# Patient Record
Sex: Female | Born: 1960 | Race: Black or African American | Hispanic: No | Marital: Married | Smoking: Former smoker
Health system: Southern US, Community
[De-identification: ages and names within clinical notes are randomized; demographics above are authoritative.]

## PROBLEM LIST (undated history)

## (undated) DIAGNOSIS — J189 Pneumonia, unspecified organism: Secondary | ICD-10-CM

## (undated) DIAGNOSIS — J40 Bronchitis, not specified as acute or chronic: Secondary | ICD-10-CM

## (undated) DIAGNOSIS — K8020 Calculus of gallbladder without cholecystitis without obstruction: Secondary | ICD-10-CM

## (undated) HISTORY — PX: TUBAL LIGATION: SHX77

## (undated) HISTORY — PX: CHOLECYSTECTOMY: SHX55

## (undated) HISTORY — PX: CERVICAL FUSION: SHX112

---

## 1998-06-26 ENCOUNTER — Ambulatory Visit (HOSPITAL_COMMUNITY): Admission: RE | Admit: 1998-06-26 | Discharge: 1998-06-26 | Payer: Self-pay | Admitting: Family Medicine

## 1998-06-26 ENCOUNTER — Encounter: Payer: Self-pay | Admitting: Family Medicine

## 1998-12-17 ENCOUNTER — Inpatient Hospital Stay (HOSPITAL_COMMUNITY): Admission: AD | Admit: 1998-12-17 | Discharge: 1998-12-20 | Payer: Self-pay | Admitting: Obstetrics and Gynecology

## 1999-03-22 ENCOUNTER — Encounter: Payer: Self-pay | Admitting: Family Medicine

## 1999-03-22 ENCOUNTER — Ambulatory Visit (HOSPITAL_COMMUNITY): Admission: RE | Admit: 1999-03-22 | Discharge: 1999-03-22 | Payer: Self-pay | Admitting: Family Medicine

## 1999-09-27 ENCOUNTER — Encounter: Payer: Self-pay | Admitting: Family Medicine

## 1999-09-27 ENCOUNTER — Ambulatory Visit (HOSPITAL_COMMUNITY): Admission: RE | Admit: 1999-09-27 | Discharge: 1999-09-27 | Payer: Self-pay | Admitting: Family Medicine

## 2002-10-14 ENCOUNTER — Other Ambulatory Visit: Admission: RE | Admit: 2002-10-14 | Discharge: 2002-10-14 | Payer: Self-pay | Admitting: Internal Medicine

## 2002-10-14 ENCOUNTER — Encounter: Payer: Self-pay | Admitting: Internal Medicine

## 2002-10-14 ENCOUNTER — Ambulatory Visit (HOSPITAL_COMMUNITY): Admission: RE | Admit: 2002-10-14 | Discharge: 2002-10-14 | Payer: Self-pay | Admitting: Internal Medicine

## 2003-10-16 ENCOUNTER — Other Ambulatory Visit: Admission: RE | Admit: 2003-10-16 | Discharge: 2003-10-16 | Payer: Self-pay | Admitting: Internal Medicine

## 2003-10-21 ENCOUNTER — Ambulatory Visit (HOSPITAL_COMMUNITY): Admission: RE | Admit: 2003-10-21 | Discharge: 2003-10-21 | Payer: Self-pay | Admitting: Internal Medicine

## 2004-09-29 ENCOUNTER — Other Ambulatory Visit: Admission: RE | Admit: 2004-09-29 | Discharge: 2004-09-29 | Payer: Self-pay | Admitting: Family Medicine

## 2005-10-03 ENCOUNTER — Other Ambulatory Visit: Admission: RE | Admit: 2005-10-03 | Discharge: 2005-10-03 | Payer: Self-pay | Admitting: Family Medicine

## 2005-10-18 ENCOUNTER — Ambulatory Visit (HOSPITAL_COMMUNITY): Admission: RE | Admit: 2005-10-18 | Discharge: 2005-10-18 | Payer: Self-pay | Admitting: Family Medicine

## 2006-10-11 ENCOUNTER — Other Ambulatory Visit: Admission: RE | Admit: 2006-10-11 | Discharge: 2006-10-11 | Payer: Self-pay | Admitting: Family Medicine

## 2006-10-23 ENCOUNTER — Ambulatory Visit (HOSPITAL_COMMUNITY): Admission: RE | Admit: 2006-10-23 | Discharge: 2006-10-23 | Payer: Self-pay | Admitting: Family Medicine

## 2007-10-15 ENCOUNTER — Other Ambulatory Visit: Admission: RE | Admit: 2007-10-15 | Discharge: 2007-10-15 | Payer: Self-pay | Admitting: Family Medicine

## 2007-10-25 ENCOUNTER — Ambulatory Visit (HOSPITAL_COMMUNITY): Admission: RE | Admit: 2007-10-25 | Discharge: 2007-10-25 | Payer: Self-pay | Admitting: Family Medicine

## 2007-11-08 ENCOUNTER — Ambulatory Visit (HOSPITAL_COMMUNITY): Admission: RE | Admit: 2007-11-08 | Discharge: 2007-11-08 | Payer: Self-pay | Admitting: Obstetrics and Gynecology

## 2008-10-15 ENCOUNTER — Other Ambulatory Visit: Admission: RE | Admit: 2008-10-15 | Discharge: 2008-10-15 | Payer: Self-pay | Admitting: Family Medicine

## 2008-10-27 ENCOUNTER — Ambulatory Visit (HOSPITAL_COMMUNITY): Admission: RE | Admit: 2008-10-27 | Discharge: 2008-10-27 | Payer: Self-pay | Admitting: Family Medicine

## 2009-10-19 ENCOUNTER — Other Ambulatory Visit: Admission: RE | Admit: 2009-10-19 | Discharge: 2009-10-19 | Payer: Self-pay | Admitting: Family Medicine

## 2009-11-02 ENCOUNTER — Ambulatory Visit (HOSPITAL_COMMUNITY): Admission: RE | Admit: 2009-11-02 | Discharge: 2009-11-02 | Payer: Self-pay | Admitting: Family Medicine

## 2009-11-05 ENCOUNTER — Encounter: Admission: RE | Admit: 2009-11-05 | Discharge: 2009-11-05 | Payer: Self-pay | Admitting: Family Medicine

## 2010-05-28 ENCOUNTER — Encounter: Admission: RE | Admit: 2010-05-28 | Discharge: 2010-05-28 | Payer: Self-pay | Admitting: Family Medicine

## 2010-06-30 ENCOUNTER — Ambulatory Visit (HOSPITAL_COMMUNITY)
Admission: RE | Admit: 2010-06-30 | Discharge: 2010-07-01 | Payer: Self-pay | Source: Home / Self Care | Attending: Neurological Surgery | Admitting: Neurological Surgery

## 2010-08-09 ENCOUNTER — Encounter
Admission: RE | Admit: 2010-08-09 | Discharge: 2010-08-09 | Payer: Self-pay | Source: Home / Self Care | Attending: Neurological Surgery | Admitting: Neurological Surgery

## 2010-08-15 ENCOUNTER — Encounter: Payer: Self-pay | Admitting: Family Medicine

## 2010-08-19 ENCOUNTER — Other Ambulatory Visit: Payer: Self-pay | Admitting: Neurological Surgery

## 2010-08-19 DIAGNOSIS — Z9889 Other specified postprocedural states: Secondary | ICD-10-CM

## 2010-09-27 ENCOUNTER — Other Ambulatory Visit (HOSPITAL_COMMUNITY): Payer: Self-pay | Admitting: Family Medicine

## 2010-09-27 DIAGNOSIS — Z1231 Encounter for screening mammogram for malignant neoplasm of breast: Secondary | ICD-10-CM

## 2010-10-05 LAB — BASIC METABOLIC PANEL
BUN: 7 mg/dL (ref 6–23)
CO2: 27 mEq/L (ref 19–32)
Creatinine, Ser: 0.94 mg/dL (ref 0.4–1.2)
GFR calc non Af Amer: >60 mL/min (ref >60)
Sodium: 140 mEq/L (ref 135–145)

## 2010-10-05 LAB — PROTIME-INR
INR: 0.89 (ref 0.00–1.49)
Prothrombin Time: 12.2 seconds (ref 11.6–15.2)

## 2010-10-05 LAB — DIFFERENTIAL
Basophils Relative: 0 % (ref 0–1)
Eosinophils Absolute: 0.1 10*3/uL (ref 0.0–0.7)
Neutrophils Relative %: 55 % (ref 43–77)

## 2010-10-05 LAB — CBC
HCT: 39.3 % (ref 36.0–46.0)
MCV: 86.2 fL (ref 78.0–100.0)
Platelets: 287 10*3/uL (ref 150–400)
RDW: 13.3 % (ref 11.5–15.5)

## 2010-10-05 LAB — APTT: aPTT: 26 seconds (ref 24–37)

## 2010-10-11 ENCOUNTER — Other Ambulatory Visit: Payer: Self-pay | Admitting: Neurological Surgery

## 2010-10-11 ENCOUNTER — Ambulatory Visit
Admission: RE | Admit: 2010-10-11 | Discharge: 2010-10-11 | Disposition: A | Discharge status: Home / Self Care | Payer: Managed Care, Other (non HMO) | Source: Ambulatory Visit | Attending: Neurological Surgery | Admitting: Neurological Surgery

## 2010-10-11 DIAGNOSIS — Z09 Encounter for follow-up examination after completed treatment for conditions other than malignant neoplasm: Secondary | ICD-10-CM

## 2010-11-08 ENCOUNTER — Ambulatory Visit (HOSPITAL_COMMUNITY)
Admission: RE | Admit: 2010-11-08 | Discharge: 2010-11-08 | Disposition: A | Discharge status: Home / Self Care | Payer: Managed Care, Other (non HMO) | Source: Ambulatory Visit | Attending: Family Medicine | Admitting: Family Medicine

## 2010-11-08 DIAGNOSIS — Z1231 Encounter for screening mammogram for malignant neoplasm of breast: Secondary | ICD-10-CM | POA: Insufficient documentation

## 2010-12-07 NOTE — Op Note (Signed)
Jessica Mclaughlin, Jessica Mclaughlin               ACCOUNT NO.:  [id number]   MEDICAL RECORD NO.:  [id number]          PATIENT TYPE:  AMB   LOCATION:  SDC                           FACILITY:  [facility]   PHYSICIAN:  Charles A. Delcambre, MDDATE OF BIRTH:  1960/08/12   DATE OF PROCEDURE:  11/08/2007  DATE OF DISCHARGE:                               OPERATIVE REPORT   PREOPERATIVE DIAGNOSIS:  Undesired fertility.   POSTOPERATIVE DIAGNOSES:  1. Undesired fertility.  2. Severe pelvic adhesive disease.   PROCEDURE:  Laparoscopic bilateral tubal ligation with bipolar cautery.   SURGEON:  Charles A. Delcambre, MD   ANESTHESIA:  General by the endotracheal route.   ASSISTANT:  None.   FINDINGS:  Normal tubes and ovaries bilaterally.  Dense adhesions of the  uterus to the anterior abdominal wall.  Multiple omental adhesions to  the anterior abdominal wall.   SPECIMENS:  None.   COMPLICATIONS:  None other than negotiating pelvic adhesive disease and  this was felt to be done safely.   ESTIMATED BLOOD LOSS:  Less than 5 mL.   COUNTS:  Instrument, sponge and needle count correct x2.   DESCRIPTION OF PROCEDURE:  The patient was taken to the operating room,  placed in supine position.  General anesthetic was induced without  difficulty.  She was then given sterile prep and drape in dorsal  lithotomy position.  Acorn cannula and tenacula were  used on the  cervix.  Quarter percent Marcaine 8 mL were placed, approximately 5 mL  at the umbilicus and 3 mL at the suprapubic site.  A 1 cm incision was  made at the umbilical site.  Anterior traction on the abdominal wall was  used to place the Veress needle.  Injection with aspiration and hanging  drop test all indicated intraperitoneal location.  Adequate  pneumoperitoneum was achieved with 2.5 liters CO2 insufflated.  With  again placing anterior traction on the abdominal wall the 10 mm scope  was placed after removal of the Veress needle.  There was no  evidence of  damage to bowel or bladder or vascular structures.  Careful inspection  of the area did yield there were omental adhesions but none were  bleeding and they appeared to be next to but had not been passed through  by the trocar.  This was verified with a 5 mm scope looking upward.  Omental and lateral sigmoid adhesions were taken down with bipolar  cautery and careful use of metzenbaum scissors.  this took approximately  30 minutes and there was no damage to bowel, vascular structures or  bladder.  A 5 mm stab incision was made suprapubically in the midline  two fingerbreadths above the symphysis pubis.  There was no damage to  bowel or bladder or vascular structures.  The patient was placed in  Trendelenburg and bowel was lifted out of the pelvis.  The bowel was not  adherent to the pelvis or to the abdominal wall but more so just omental  adhesions.  Decision was made to proceed with bipolar cautery so as not  to have to place another  trocar with the adhesions.  Bipolar cautery was  then used to coagulate approximately 3 cm of tube on either side  including down into the mesosalpinx.  Cautery was carried down until  tone indicated no further current.  There was no damage to surrounding  structures.  Desufflation was allowed to occur after a 5 mm scope had  been used through the lower port to look back at the umbilical port.  I  could see no evidence of any bowel injury, just nearby omental adhesions  from where the trocar was placed.  Desufflation was allowed to occur.  Low pressure visualization yielded good hemostasis.  Zero Vicryl was  used to close the fascia at the umbilical site.  The umbilical incision  was thereafter noted to be amenable to Dura-Bond and Dura-Bond was  placed to close the skin.  Dura-Bond was also placed over the 5 mm port  site without difficulty.  The tenacula was removed from the cervix.  Hemostasis was adequate.  Acorn cannula and speculum were  removed.  The  patient was awakened and taken to recovery with physician in attendance  having tolerated the procedure well.      Charles A. Sydnee Cabal, MD  Electronically Signed     CAD/MEDQ  D:  11/08/2007  T:  11/08/2007  Job:  [id number]

## 2011-04-19 LAB — CBC
HCT: 38
Hemoglobin: 13.1
MCHC: 34.3
Platelets: 292
RDW: 13.6

## 2011-04-19 LAB — HCG, SERUM, QUALITATIVE: Preg, Serum: NEGATIVE

## 2011-05-10 ENCOUNTER — Other Ambulatory Visit: Payer: Self-pay | Admitting: Family Medicine

## 2011-05-10 DIAGNOSIS — M545 Low back pain: Secondary | ICD-10-CM

## 2011-05-10 DIAGNOSIS — R202 Paresthesia of skin: Secondary | ICD-10-CM

## 2011-05-12 ENCOUNTER — Ambulatory Visit
Admission: RE | Admit: 2011-05-12 | Discharge: 2011-05-12 | Disposition: A | Discharge status: Home / Self Care | Payer: Managed Care, Other (non HMO) | Source: Ambulatory Visit | Attending: Family Medicine | Admitting: Family Medicine

## 2011-05-12 DIAGNOSIS — M545 Low back pain: Secondary | ICD-10-CM

## 2011-05-12 DIAGNOSIS — R202 Paresthesia of skin: Secondary | ICD-10-CM

## 2011-08-30 ENCOUNTER — Other Ambulatory Visit: Payer: Self-pay | Admitting: Neurological Surgery

## 2011-08-30 ENCOUNTER — Ambulatory Visit
Admission: RE | Admit: 2011-08-30 | Discharge: 2011-08-30 | Disposition: A | Discharge status: Home / Self Care | Payer: Managed Care, Other (non HMO) | Source: Ambulatory Visit | Attending: Neurological Surgery | Admitting: Neurological Surgery

## 2011-08-30 DIAGNOSIS — M4800 Spinal stenosis, site unspecified: Secondary | ICD-10-CM

## 2011-08-30 DIAGNOSIS — M25552 Pain in left hip: Secondary | ICD-10-CM

## 2011-08-30 DIAGNOSIS — M25551 Pain in right hip: Secondary | ICD-10-CM

## 2011-08-30 DIAGNOSIS — M5126 Other intervertebral disc displacement, lumbar region: Secondary | ICD-10-CM

## 2011-09-05 ENCOUNTER — Ambulatory Visit
Admission: RE | Admit: 2011-09-05 | Discharge: 2011-09-05 | Disposition: A | Discharge status: Home / Self Care | Payer: Managed Care, Other (non HMO) | Source: Ambulatory Visit | Attending: Neurological Surgery | Admitting: Neurological Surgery

## 2011-09-05 DIAGNOSIS — M4800 Spinal stenosis, site unspecified: Secondary | ICD-10-CM

## 2011-09-05 DIAGNOSIS — M5126 Other intervertebral disc displacement, lumbar region: Secondary | ICD-10-CM

## 2011-09-05 MED ORDER — DIAZEPAM 5 MG PO TABS
10.0000 mg | ORAL_TABLET | Freq: Once | ORAL | Status: AC
  Administered 2011-09-05: 10 mg via ORAL

## 2011-09-05 NOTE — Progress Notes (Signed)
Discharge instructions explained, consent signed and valium given. 

## 2011-10-04 ENCOUNTER — Other Ambulatory Visit (HOSPITAL_COMMUNITY): Payer: Self-pay | Admitting: Family Medicine

## 2011-10-04 DIAGNOSIS — Z1231 Encounter for screening mammogram for malignant neoplasm of breast: Secondary | ICD-10-CM

## 2011-11-02 ENCOUNTER — Encounter (HOSPITAL_COMMUNITY): Payer: Self-pay | Admitting: Pharmacy Technician

## 2011-11-09 ENCOUNTER — Encounter (HOSPITAL_COMMUNITY)
Admission: RE | Admit: 2011-11-09 | Discharge: 2011-11-09 | Disposition: A | Discharge status: Home / Self Care | Payer: Managed Care, Other (non HMO) | Source: Ambulatory Visit | Attending: Neurological Surgery | Admitting: Neurological Surgery

## 2011-11-09 ENCOUNTER — Encounter (HOSPITAL_COMMUNITY): Payer: Self-pay

## 2011-11-09 HISTORY — DX: Calculus of gallbladder without cholecystitis without obstruction: K80.20

## 2011-11-09 HISTORY — DX: Bronchitis, not specified as acute or chronic: J40

## 2011-11-09 HISTORY — DX: Pneumonia, unspecified organism: J18.9

## 2011-11-09 LAB — DIFFERENTIAL
Basophils Relative: 0 % (ref 0–1)
Lymphs Abs: 2 10*3/uL (ref 0.7–4.0)
Monocytes Absolute: 0.3 10*3/uL (ref 0.1–1.0)
Monocytes Relative: 6 % (ref 3–12)
Neutro Abs: 3 10*3/uL (ref 1.7–7.7)

## 2011-11-09 LAB — CBC
HCT: 36.9 % (ref 36.0–46.0)
Hemoglobin: 12.4 g/dL (ref 12.0–15.0)
MCH: 27.7 pg (ref 26.0–34.0)
MCHC: 33.6 g/dL (ref 30.0–36.0)

## 2011-11-09 LAB — BASIC METABOLIC PANEL
BUN: 12 mg/dL (ref 6–23)
Chloride: 105 mEq/L (ref 96–112)
Glucose, Bld: 89 mg/dL (ref 70–99)
Potassium: 4.5 mEq/L (ref 3.5–5.1)

## 2011-11-09 LAB — HCG, SERUM, QUALITATIVE: Preg, Serum: NEGATIVE

## 2011-11-09 LAB — SURGICAL PCR SCREEN: Staphylococcus aureus: NEGATIVE

## 2011-11-09 NOTE — Pre-Procedure Instructions (Signed)
20 Jessica Mclaughlin  11/09/2011   Your procedure is scheduled on:  Wednesday November 16, 2011  Report to [facility] at 0630 AM.  Call this number if you have problems the morning of surgery: [phone]   Remember:   Do not eat food:After Midnight.  May have clear liquids: up to 4 Hours before arrival. (up to 2:30am)  Clear liquids include soda, tea, black coffee, apple or grape juice, broth.  Take these medicines the morning of surgery with A SIP OF WATER: none   Do not wear jewelry, make-up or nail polish.  Do not wear lotions, powders, or perfumes. You may wear deodorant.  Do not shave 48 hours prior to surgery.  Do not bring valuables to the hospital.  Contacts, dentures or bridgework may not be worn into surgery.  Leave suitcase in the car. After surgery it may be brought to your room.  For patients admitted to the hospital, checkout time is 11:00 AM the day of discharge.   Patients discharged the day of surgery will not be allowed to drive home.  Name and phone number of your driver: Aubri Gathright [phone]  Special Instructions: CHG Shower Use Special Wash: 1/2 bottle night before surgery and 1/2 bottle morning of surgery.   Please read over the following fact sheets that you were given: Pain Booklet, Coughing and Deep Breathing, MRSA Information and Surgical Site Infection Prevention

## 2011-11-14 ENCOUNTER — Ambulatory Visit (HOSPITAL_COMMUNITY)
Admission: RE | Admit: 2011-11-14 | Discharge: 2011-11-14 | Disposition: A | Discharge status: Home / Self Care | Payer: Managed Care, Other (non HMO) | Source: Ambulatory Visit | Attending: Family Medicine | Admitting: Family Medicine

## 2011-11-14 DIAGNOSIS — Z1231 Encounter for screening mammogram for malignant neoplasm of breast: Secondary | ICD-10-CM | POA: Insufficient documentation

## 2011-11-14 DIAGNOSIS — Z87891 Personal history of nicotine dependence: Secondary | ICD-10-CM | POA: Insufficient documentation

## 2011-11-14 DIAGNOSIS — J9819 Other pulmonary collapse: Secondary | ICD-10-CM | POA: Insufficient documentation

## 2011-11-15 MED ORDER — CEFAZOLIN SODIUM 1-5 GM-% IV SOLN: 1.0000 g | Freq: Once | INTRAVENOUS | Status: DC

## 2011-11-15 MED ORDER — CEFAZOLIN SODIUM-DEXTROSE 2-3 GM-% IV SOLR
2.0000 g | Freq: Once | INTRAVENOUS | Status: AC
  Administered 2011-11-16: 2 g via INTRAVENOUS
  Filled 2011-11-15 (×2): qty 50

## 2011-11-16 ENCOUNTER — Encounter (HOSPITAL_COMMUNITY): Payer: Self-pay | Admitting: Anesthesiology

## 2011-11-16 ENCOUNTER — Ambulatory Visit (HOSPITAL_COMMUNITY)
Admission: RE | Admit: 2011-11-16 | Discharge: 2011-11-17 | Disposition: A | Discharge status: Home / Self Care | Payer: Managed Care, Other (non HMO) | Source: Ambulatory Visit | Attending: Neurological Surgery | Admitting: Neurological Surgery

## 2011-11-16 ENCOUNTER — Encounter (HOSPITAL_COMMUNITY): Payer: Self-pay | Admitting: Neurological Surgery

## 2011-11-16 ENCOUNTER — Encounter (HOSPITAL_COMMUNITY)
Admission: RE | Disposition: A | Discharge status: Home / Self Care | Payer: Self-pay | Source: Ambulatory Visit | Attending: Neurological Surgery

## 2011-11-16 ENCOUNTER — Ambulatory Visit (HOSPITAL_COMMUNITY): Payer: Managed Care, Other (non HMO) | Admitting: Anesthesiology

## 2011-11-16 ENCOUNTER — Ambulatory Visit (HOSPITAL_COMMUNITY): Payer: Managed Care, Other (non HMO)

## 2011-11-16 DIAGNOSIS — Z01818 Encounter for other preprocedural examination: Secondary | ICD-10-CM | POA: Insufficient documentation

## 2011-11-16 DIAGNOSIS — M48061 Spinal stenosis, lumbar region without neurogenic claudication: Secondary | ICD-10-CM | POA: Insufficient documentation

## 2011-11-16 DIAGNOSIS — Z01810 Encounter for preprocedural cardiovascular examination: Secondary | ICD-10-CM | POA: Insufficient documentation

## 2011-11-16 DIAGNOSIS — Z9889 Other specified postprocedural states: Secondary | ICD-10-CM

## 2011-11-16 DIAGNOSIS — Z01812 Encounter for preprocedural laboratory examination: Secondary | ICD-10-CM | POA: Insufficient documentation

## 2011-11-16 HISTORY — PX: LUMBAR LAMINECTOMY/DECOMPRESSION MICRODISCECTOMY: SHX5026

## 2011-11-16 SURGERY — LUMBAR LAMINECTOMY/DECOMPRESSION MICRODISCECTOMY 2 LEVELS
Anesthesia: General | Site: Back | Wound class: Clean

## 2011-11-16 MED ORDER — METHOCARBAMOL 500 MG PO TABS
500.0000 mg | ORAL_TABLET | Freq: Four times a day (QID) | ORAL | Status: DC | PRN
  Administered 2011-11-17: 500 mg via ORAL
  Filled 2011-11-16: qty 1

## 2011-11-16 MED ORDER — SODIUM CHLORIDE 0.9 % IJ SOLN
3.0000 mL | Freq: Two times a day (BID) | INTRAMUSCULAR | Status: DC
  Administered 2011-11-16 (×2): 3 mL via INTRAVENOUS

## 2011-11-16 MED ORDER — ONDANSETRON HCL 4 MG/2ML IJ SOLN
INTRAMUSCULAR | Status: AC
  Filled 2011-11-16: qty 2

## 2011-11-16 MED ORDER — SENNA 8.6 MG PO TABS
1.0000 | ORAL_TABLET | Freq: Two times a day (BID) | ORAL | Status: DC
  Administered 2011-11-16: 8.6 mg via ORAL
  Filled 2011-11-16 (×3): qty 1

## 2011-11-16 MED ORDER — MENTHOL 3 MG MT LOZG: 1.0000 | LOZENGE | OROMUCOSAL | Status: DC | PRN

## 2011-11-16 MED ORDER — ACETAMINOPHEN 650 MG RE SUPP: 650.0000 mg | RECTAL | Status: DC | PRN

## 2011-11-16 MED ORDER — MIDAZOLAM HCL 5 MG/5ML IJ SOLN
INTRAMUSCULAR | Status: DC | PRN
  Administered 2011-11-16: 2 mg via INTRAVENOUS

## 2011-11-16 MED ORDER — HEMOSTATIC AGENTS (NO CHARGE) OPTIME
TOPICAL | Status: DC | PRN
  Administered 2011-11-16: 1 via TOPICAL

## 2011-11-16 MED ORDER — DEXTROSE 5 % IV SOLN
500.0000 mg | Freq: Four times a day (QID) | INTRAVENOUS | Status: DC | PRN
  Filled 2011-11-16: qty 5

## 2011-11-16 MED ORDER — PROPOFOL 10 MG/ML IV EMUL
INTRAVENOUS | Status: DC | PRN
  Administered 2011-11-16: 150 mg via INTRAVENOUS

## 2011-11-16 MED ORDER — SODIUM CHLORIDE 0.9 % IV SOLN
INTRAVENOUS | Status: AC
  Filled 2011-11-16: qty 500

## 2011-11-16 MED ORDER — FENTANYL CITRATE 0.05 MG/ML IJ SOLN
INTRAMUSCULAR | Status: DC | PRN
  Administered 2011-11-16: 150 ug via INTRAVENOUS
  Administered 2011-11-16: 50 ug via INTRAVENOUS

## 2011-11-16 MED ORDER — KETOROLAC TROMETHAMINE 30 MG/ML IJ SOLN
INTRAMUSCULAR | Status: DC | PRN
  Administered 2011-11-16: 30 mg via INTRAVENOUS

## 2011-11-16 MED ORDER — LACTATED RINGERS IV SOLN
INTRAVENOUS | Status: DC | PRN
  Administered 2011-11-16 (×2): via INTRAVENOUS

## 2011-11-16 MED ORDER — SODIUM CHLORIDE 0.9 % IR SOLN
Status: DC | PRN
  Administered 2011-11-16: 09:00:00

## 2011-11-16 MED ORDER — NEOSTIGMINE METHYLSULFATE 1 MG/ML IJ SOLN
INTRAMUSCULAR | Status: DC | PRN
  Administered 2011-11-16: 3 mg via INTRAVENOUS

## 2011-11-16 MED ORDER — LIDOCAINE HCL (CARDIAC) 20 MG/ML IV SOLN
INTRAVENOUS | Status: DC | PRN
  Administered 2011-11-16: 50 mg via INTRAVENOUS

## 2011-11-16 MED ORDER — ONDANSETRON HCL 4 MG/2ML IJ SOLN
INTRAMUSCULAR | Status: DC | PRN
  Administered 2011-11-16: 4 mg via INTRAVENOUS

## 2011-11-16 MED ORDER — POTASSIUM CHLORIDE IN NACL 20-0.9 MEQ/L-% IV SOLN
INTRAVENOUS | Status: DC
  Filled 2011-11-16 (×3): qty 1000

## 2011-11-16 MED ORDER — ONDANSETRON HCL 4 MG/2ML IJ SOLN
4.0000 mg | Freq: Once | INTRAMUSCULAR | Status: AC | PRN
  Administered 2011-11-16: 4 mg via INTRAVENOUS

## 2011-11-16 MED ORDER — DEXAMETHASONE SODIUM PHOSPHATE 10 MG/ML IJ SOLN
10.0000 mg | Freq: Once | INTRAMUSCULAR | Status: AC
  Administered 2011-11-16: 10 mg via INTRAVENOUS

## 2011-11-16 MED ORDER — DEXAMETHASONE SODIUM PHOSPHATE 10 MG/ML IJ SOLN
INTRAMUSCULAR | Status: AC
  Filled 2011-11-16: qty 1

## 2011-11-16 MED ORDER — ONDANSETRON HCL 4 MG/2ML IJ SOLN
4.0000 mg | INTRAMUSCULAR | Status: DC | PRN
  Administered 2011-11-16: 4 mg via INTRAVENOUS
  Filled 2011-11-16: qty 2

## 2011-11-16 MED ORDER — GLYCOPYRROLATE 0.2 MG/ML IJ SOLN
INTRAMUSCULAR | Status: DC | PRN
  Administered 2011-11-16: .6 mg via INTRAVENOUS

## 2011-11-16 MED ORDER — CEFAZOLIN SODIUM 1-5 GM-% IV SOLN
1.0000 g | Freq: Three times a day (TID) | INTRAVENOUS | Status: AC
  Administered 2011-11-16 – 2011-11-17 (×2): 1 g via INTRAVENOUS
  Filled 2011-11-16 (×2): qty 50

## 2011-11-16 MED ORDER — DEXAMETHASONE 4 MG PO TABS
4.0000 mg | ORAL_TABLET | Freq: Four times a day (QID) | ORAL | Status: DC
  Administered 2011-11-16 – 2011-11-17 (×2): 4 mg via ORAL
  Filled 2011-11-16 (×6): qty 1

## 2011-11-16 MED ORDER — MORPHINE SULFATE 2 MG/ML IJ SOLN: 0.0500 mg/kg | INTRAMUSCULAR | Status: DC | PRN

## 2011-11-16 MED ORDER — PHENOL 1.4 % MT LIQD: 1.0000 | OROMUCOSAL | Status: DC | PRN

## 2011-11-16 MED ORDER — DEXAMETHASONE SODIUM PHOSPHATE 4 MG/ML IJ SOLN
4.0000 mg | Freq: Four times a day (QID) | INTRAMUSCULAR | Status: DC
  Administered 2011-11-16 (×2): 4 mg via INTRAVENOUS
  Filled 2011-11-16 (×6): qty 1

## 2011-11-16 MED ORDER — ACETAMINOPHEN 325 MG PO TABS: 650.0000 mg | ORAL_TABLET | ORAL | Status: DC | PRN

## 2011-11-16 MED ORDER — ROCURONIUM BROMIDE 100 MG/10ML IV SOLN
INTRAVENOUS | Status: DC | PRN
  Administered 2011-11-16: 10 mg via INTRAVENOUS
  Administered 2011-11-16: 50 mg via INTRAVENOUS
  Administered 2011-11-16: 10 mg via INTRAVENOUS

## 2011-11-16 MED ORDER — HYDROCODONE-ACETAMINOPHEN 5-325 MG PO TABS
1.0000 | ORAL_TABLET | ORAL | Status: DC | PRN
  Administered 2011-11-16 – 2011-11-17 (×3): 1 via ORAL
  Filled 2011-11-16 (×3): qty 1

## 2011-11-16 MED ORDER — THROMBIN 5000 UNITS EX SOLR
CUTANEOUS | Status: DC | PRN
  Administered 2011-11-16 (×2): 5000 [IU] via TOPICAL

## 2011-11-16 MED ORDER — HYDROMORPHONE HCL PF 1 MG/ML IJ SOLN
0.2500 mg | INTRAMUSCULAR | Status: DC | PRN
  Administered 2011-11-16 (×2): 0.5 mg via INTRAVENOUS

## 2011-11-16 MED ORDER — BACITRACIN 50000 UNITS IM SOLR
INTRAMUSCULAR | Status: AC
  Filled 2011-11-16: qty 1

## 2011-11-16 MED ORDER — ZOLPIDEM TARTRATE 5 MG PO TABS: 10.0000 mg | ORAL_TABLET | Freq: Every evening | ORAL | Status: DC | PRN

## 2011-11-16 MED ORDER — BUPIVACAINE HCL (PF) 0.25 % IJ SOLN
INTRAMUSCULAR | Status: DC | PRN
  Administered 2011-11-16: 3 mL

## 2011-11-16 MED ORDER — HYDROMORPHONE HCL PF 1 MG/ML IJ SOLN
INTRAMUSCULAR | Status: AC
  Filled 2011-11-16: qty 1

## 2011-11-16 MED ORDER — MORPHINE SULFATE 4 MG/ML IJ SOLN
1.0000 mg | INTRAMUSCULAR | Status: DC | PRN
  Administered 2011-11-16: 4 mg via INTRAVENOUS
  Filled 2011-11-16: qty 1

## 2011-11-16 MED ORDER — 0.9 % SODIUM CHLORIDE (POUR BTL) OPTIME
TOPICAL | Status: DC | PRN
  Administered 2011-11-16: 1000 mL

## 2011-11-16 MED ORDER — SODIUM CHLORIDE 0.9 % IJ SOLN
3.0000 mL | INTRAMUSCULAR | Status: DC | PRN
  Administered 2011-11-16: 3 mL via INTRAVENOUS

## 2011-11-16 SURGICAL SUPPLY — 47 items
APL SKNCLS STERI-STRIP NONHPOA (GAUZE/BANDAGES/DRESSINGS) ×1
BAG DECANTER FOR FLEXI CONT (MISCELLANEOUS) ×2 IMPLANT
BENZOIN TINCTURE PRP APPL 2/3 (GAUZE/BANDAGES/DRESSINGS) ×2 IMPLANT
BUR MATCHSTICK NEURO 3.0 LAGG (BURR) ×2 IMPLANT
CANISTER SUCTION 2500CC (MISCELLANEOUS) ×2 IMPLANT
CLOTH BEACON ORANGE TIMEOUT ST (SAFETY) ×2 IMPLANT
CONT SPEC 4OZ CLIKSEAL STRL BL (MISCELLANEOUS) ×2 IMPLANT
DRAPE LAPAROTOMY 100X72X124 (DRAPES) ×2 IMPLANT
DRAPE MICROSCOPE ZEISS OPMI (DRAPES) ×2 IMPLANT
DRAPE POUCH INSTRU U-SHP 10X18 (DRAPES) ×2 IMPLANT
DRAPE SURG 17X23 STRL (DRAPES) ×2 IMPLANT
DRESSING TELFA 8X3 (GAUZE/BANDAGES/DRESSINGS) ×2 IMPLANT
DRSG OPSITE 4X5.5 SM (GAUZE/BANDAGES/DRESSINGS) ×2 IMPLANT
DURAPREP 26ML APPLICATOR (WOUND CARE) ×2 IMPLANT
ELECT REM PT RETURN 9FT ADLT (ELECTROSURGICAL) ×2
ELECTRODE REM PT RTRN 9FT ADLT (ELECTROSURGICAL) ×1 IMPLANT
GAUZE SPONGE 4X4 16PLY XRAY LF (GAUZE/BANDAGES/DRESSINGS) IMPLANT
GLOVE BIO SURGEON STRL SZ8 (GLOVE) ×2 IMPLANT
GLOVE BIOGEL PI IND STRL 8 (GLOVE) IMPLANT
GLOVE BIOGEL PI IND STRL 8.5 (GLOVE) IMPLANT
GLOVE BIOGEL PI INDICATOR 8 (GLOVE) ×1
GLOVE BIOGEL PI INDICATOR 8.5 (GLOVE) ×1
GLOVE ECLIPSE 7.5 STRL STRAW (GLOVE) ×1 IMPLANT
GLOVE SURG SS PI 8.0 STRL IVOR (GLOVE) ×2 IMPLANT
GOWN BRE IMP SLV AUR LG STRL (GOWN DISPOSABLE) IMPLANT
GOWN BRE IMP SLV AUR XL STRL (GOWN DISPOSABLE) ×3 IMPLANT
GOWN STRL REIN 2XL LVL4 (GOWN DISPOSABLE) ×2 IMPLANT
HEMOSTAT POWDER KIT SURGIFOAM (HEMOSTASIS) IMPLANT
KIT BASIN OR (CUSTOM PROCEDURE TRAY) ×2 IMPLANT
KIT ROOM TURNOVER OR (KITS) ×2 IMPLANT
NDL SPNL 20GX3.5 QUINCKE YW (NEEDLE) IMPLANT
NEEDLE HYPO 25X1 1.5 SAFETY (NEEDLE) ×2 IMPLANT
NEEDLE SPNL 20GX3.5 QUINCKE YW (NEEDLE) ×2 IMPLANT
NS IRRIG 1000ML POUR BTL (IV SOLUTION) ×2 IMPLANT
PACK LAMINECTOMY NEURO (CUSTOM PROCEDURE TRAY) ×2 IMPLANT
PAD ARMBOARD 7.5X6 YLW CONV (MISCELLANEOUS) ×6 IMPLANT
RUBBERBAND STERILE (MISCELLANEOUS) ×4 IMPLANT
SPONGE SURGIFOAM ABS GEL SZ50 (HEMOSTASIS) ×2 IMPLANT
STRIP CLOSURE SKIN 1/2X4 (GAUZE/BANDAGES/DRESSINGS) ×2 IMPLANT
SUT VIC AB 0 CT1 18XCR BRD8 (SUTURE) ×1 IMPLANT
SUT VIC AB 0 CT1 8-18 (SUTURE) ×2
SUT VIC AB 2-0 CP2 18 (SUTURE) ×2 IMPLANT
SUT VIC AB 3-0 SH 8-18 (SUTURE) ×3 IMPLANT
SYR 20ML ECCENTRIC (SYRINGE) ×2 IMPLANT
TOWEL OR 17X24 6PK STRL BLUE (TOWEL DISPOSABLE) ×2 IMPLANT
TOWEL OR 17X26 10 PK STRL BLUE (TOWEL DISPOSABLE) ×2 IMPLANT
WATER STERILE IRR 1000ML POUR (IV SOLUTION) ×2 IMPLANT

## 2011-11-16 NOTE — OR Nursing (Signed)
Addendum: Bupivacaine 0.25% 10cc more at the end of the case.

## 2011-11-16 NOTE — Transfer of Care (Signed)
Immediate Anesthesia Transfer of Care Note  Patient: Jessica Mclaughlin  Procedure(s) Performed: Procedure(s) (LRB): LUMBAR LAMINECTOMY/DECOMPRESSION MICRODISCECTOMY 2 LEVELS (N/A)  Patient Location: PACU  Anesthesia Type: General  Level of Consciousness: sedated  Airway & Oxygen Therapy: Patient Spontanous Breathing  Post-op Assessment: Report given to PACU RN and Post -op Vital signs reviewed and stable  Post vital signs: Reviewed and stable  Complications: No apparent anesthesia complications

## 2011-11-16 NOTE — Anesthesia Procedure Notes (Signed)
Procedure Name: Intubation Date/Time: 11/16/2011 8:50 AM Performed by: Gwenyth Allegra Pre-anesthesia Checklist: Patient identified, Timeout performed, Emergency Drugs available, Suction available and Patient being monitored Patient Re-evaluated:Patient Re-evaluated prior to inductionOxygen Delivery Method: Circle system utilized Preoxygenation: Pre-oxygenation with 100% oxygen Intubation Type: IV induction Ventilation: Mask ventilation without difficulty and Oral airway inserted - appropriate to patient size Laryngoscope Size: Mac and 3 Grade View: Grade II Tube type: Oral Number of attempts: 1 Airway Equipment and Method: Stylet Placement Confirmation: breath sounds checked- equal and bilateral,  ETT inserted through vocal cords under direct vision and positive ETCO2 Secured at: 21 cm Tube secured with: Tape Dental Injury: Teeth and Oropharynx as per pre-operative assessment

## 2011-11-16 NOTE — Preoperative (Signed)
Beta Blockers   Reason not to administer Beta Blockers:Not Applicable2 

## 2011-11-16 NOTE — Anesthesia Postprocedure Evaluation (Signed)
Anesthesia Post Note  Patient: Jessica Mclaughlin  Procedure(s) Performed: Procedure(s) (LRB): LUMBAR LAMINECTOMY/DECOMPRESSION MICRODISCECTOMY 2 LEVELS (N/A)  Anesthesia type: general  Patient location: PACU  Post pain: Pain level controlled  Post assessment: Patient's Cardiovascular Status Stable  Last Vitals:  Filed Vitals:   11/16/11 1100  BP: 119/77  Pulse: 84  Temp:   Resp: 12    Post vital signs: Reviewed and stable  Level of consciousness: sedated  Complications: No apparent anesthesia complications

## 2011-11-16 NOTE — Op Note (Signed)
11/16/2011  10:39 AM  PATIENT:  Jessica Mclaughlin  51 y.o. female  PRE-OPERATIVE DIAGNOSIS:  Lumbar spinal stenosis L4-5 L5-S1 with back and leg pain right greater than left  POST-OPERATIVE DIAGNOSIS:  Same  PROCEDURE:  Decompressive lumbar hemilaminectomy medial facetectomy and foraminotomies at L4-5 and L5-S1 bilaterally  SURGEON:  Marikay Alar, MD  ASSISTANTS: Dr. Newell Coral  ANESTHESIA:   General  EBL: 30 ml  Total I/O In: 1600 [I.V.:1600] Out: 30 [Blood:30]  BLOOD ADMINISTERED:none  DRAINS: None   SPECIMEN:  No Specimen  INDICATION FOR PROCEDURE: This patient presented with bilateral leg pain down the backs of the legs right greater than left. CT myelogram showed lateral recess stenosis L4-5 and L5-S1. She tried medical management without relief. Recommended decompressive laminectomy L4-5 L5-S1. Patient understood the risks, benefits, and alternatives and potential outcomes and wished to proceed.  PROCEDURE DETAILS: The patient was taken to the operating room and after induction of adequate generalized endotracheal anesthesia, the patient was rolled into the prone position on the Wilson frame and all pressure points were padded. The lumbar region was cleaned and then prepped with DuraPrep and draped in the usual sterile fashion. 5 cc of local anesthesia was injected and then a dorsal midline incision was made and carried down to the lumbo sacral fascia. The fascia was opened and the paraspinous musculature was taken down in a subperiosteal fashion to expose L4-5 and L5-S1 bilaterally. Intraoperative x-ray confirmed my level, and then I used a combination of the high-speed drill and the Kerrison punches to perform a hemilaminectomy, medial facetectomy, and foraminotomy at 45 and L5-S1 bilaterally. The underlying yellow ligament was opened and removed in a piecemeal fashion to expose the underlying dura and exiting nerve root at each level bilaterally. I undercut the lateral recess and  dissected down until I was medial to and distal to the pedicle. The nerve root was well decompressed at each level bilaterally. We then gently retracted the nerve root medially with a retractor and inspected the disc at each level. There was a subannular disc herniation at L5-S1 on the left foot the decision was to not cut this and perform intradiscal discectomy and simply do a decompression because of her preoperative pain syndrome was more right-sided than left-sided, and I did not want to risk of destabilizing the segment. I then palpated with a coronary dilator along the nerve root and into the foramen to assure adequate decompression. I felt no more compression of the nerve roots. I irrigated with saline solution containing bacitracin. Achieved hemostasis with bipolar cautery, lined the dura with Gelfoam, and then closed the fascia with 0 Vicryl. I closed the subcutaneous tissues with 2-0 Vicryl and the subcuticular tissues with 3-0 Vicryl. The skin was then closed with benzoin and Steri-Strips. The drapes were removed, a sterile dressing was applied. The patient was awakened from general anesthesia and transferred to the recovery room in stable condition. At the end of the procedure all sponge, needle and instrument counts were correct.   PLAN OF CARE: Admit for overnight observation  PATIENT DISPOSITION:  PACU - hemodynamically stable.   Delay start of Pharmacological VTE agent (>24hrs) due to surgical blood loss or risk of bleeding:  yes

## 2011-11-16 NOTE — H&P (Signed)
Subjective: Patient is a 51 y.o. female admitted for DLL L4-5 L5-S1. Onset of symptoms was several months ago, gradually worsening since that time.  The pain is rated severe, and is located at the across the lower back and radiates to legs R>L. The pain is described as aching and throbbing and occurs intermittently. The symptoms have been progressive. Symptoms are exacerbated by exercise. MRI or CT showed stenosis/ HNP.   Past Medical History  Diagnosis Date  . Pneumonia   . Bronchitis     hx of  . Gallstones     hx of    Past Surgical History  Procedure Date  . Cholecystectomy   . Tubal ligation   . Cesarean section     x2  . Cervical fusion     "and plating"    Prior to Admission medications   Medication Sig Start Date End Date Taking? Authorizing Provider  ibuprofen (ADVIL,MOTRIN) 200 MG tablet Take 200 mg by mouth every 8 (eight) hours as needed. LEG PAIN   Yes Historical Provider, MD  ibuprofen (ADVIL,MOTRIN) 800 MG tablet Take 800 mg by mouth every 6 (six) hours as needed. Leg pain   Yes Historical Provider, MD  Linoleic Acid Conjugated (CONJUGATED LINOLEIC ACID PO) Take 2 tablets by mouth daily. DIETARY SUPPLEMENT   Yes Historical Provider, MD   No Known Allergies  History  Substance Use Topics  . Smoking status: Former Smoker    Quit date: 06/23/2010  . Smokeless tobacco: Not on file  . Alcohol Use: No    No family history on file.   Review of Systems  Positive ROS: neg  All other systems have been reviewed and were otherwise negative with the exception of those mentioned in the HPI and as above.  Objective: Vital signs in last 24 hours: Temp:  [98 F (36.7 C)] 98 F (36.7 C) (04/24 4098) Pulse Rate:  [74] 74  (04/24 0637) Resp:  [20] 20  (04/24 0637) BP: (106)/(74) 106/74 mmHg (04/24 0637) SpO2:  [97 %] 97 % (04/24 0637)  General Appearance: Alert, cooperative, no distress, appears stated age Head: Normocephalic, without obvious abnormality,  atraumatic Eyes: PERRL, conjunctiva/corneas clear, EOM's intact, fundi benign, both eyes      Ears: Normal TM's and external ear canals, both ears Throat: Lips, mucosa, and tongue normal; teeth and gums normal Neck: Supple, symmetrical, trachea midline, no adenopathy; thyroid: No enlargement/tenderness/nodules; no carotid bruit or JVD Back: Symmetric, no curvature, ROM normal, no CVA tenderness Lungs: Clear to auscultation bilaterally, respirations unlabored Heart: Regular rate and rhythm, S1 and S2 normal, no murmur, rub or gallop Abdomen: Soft, non-tender, bowel sounds active all four quadrants, no masses, no organomegaly Extremities: Extremities normal, atraumatic, no cyanosis or edema Pulses: 2+ and symmetric all extremities Skin: Skin color, texture, turgor normal, no rashes or lesions  NEUROLOGIC:   Mental status: Alert and oriented x4,  no aphasia, good attention span, fund of knowledge, and memory Motor Exam - grossly normal, some weakness R HF Sensory Exam - grossly normal Reflexes: 1+ Coordination - grossly normal Gait - grossly normal Balance - grossly normal Cranial Nerves: I: smell Not tested  II: visual acuity  OS: nl    OD: nl  II: visual fields Full to confrontation  II: pupils Equal, round, reactive to light  III,VII: ptosis None  III,IV,VI: extraocular muscles  Full ROM  V: mastication Normal  V: facial light touch sensation  Normal  V,VII: corneal reflex  Present  VII: facial muscle  function - upper  Normal  VII: facial muscle function - lower Normal  VIII: hearing Not tested  IX: soft palate elevation  Normal  IX,X: gag reflex Present  XI: trapezius strength  5/5  XI: sternocleidomastoid strength 5/5  XI: neck flexion strength  5/5  XII: tongue strength  Normal    Data Review Lab Results  Component Value Date   WBC 5.4 11/09/2011   HGB 12.4 11/09/2011   HCT 36.9 11/09/2011   MCV 82.6 11/09/2011   PLT 283 11/09/2011   Lab Results  Component Value  Date   NA 139 11/09/2011   K 4.5 11/09/2011   CL 105 11/09/2011   CO2 26 11/09/2011   BUN 12 11/09/2011   CREATININE 0.95 11/09/2011   GLUCOSE 89 11/09/2011   Lab Results  Component Value Date   INR 0.98 11/09/2011    Assessment/Plan: Patient admitted for [facility] for spinal stenosis. Patient has failed conservative therapy.  I explained the condition and procedure to the patient and answered any questions.  Patient wishes to proceed with procedure as planned. Understands risks/ benefits and typical outcomes of procedure.   Dream Nodal S 11/16/2011 8:10 AM

## 2011-11-16 NOTE — Anesthesia Preprocedure Evaluation (Addendum)
Anesthesia Evaluation  Patient identified by MRN, date of birth, ID band Patient awake    Reviewed: Allergy & Precautions, H&P , NPO status , Patient's Chart, lab work & pertinent test results, reviewed documented beta blocker date and time   Airway Mallampati: I TM Distance: >3 FB Neck ROM: Full    Dental  (+) Teeth Intact   Pulmonary    Pulmonary exam normal       Cardiovascular Rhythm:Regular Rate:Normal     Neuro/Psych    GI/Hepatic   Endo/Other    Renal/GU      Musculoskeletal   Abdominal Normal abdominal exam  (+)   Peds  Hematology   Anesthesia Other Findings   Reproductive/Obstetrics                          Anesthesia Physical Anesthesia Plan  ASA: II  Anesthesia Plan: General   Post-op Pain Management:    Induction: Intravenous  Airway Management Planned: Oral ETT  Additional Equipment:   Intra-op Plan:   Post-operative Plan: Extubation in OR  Informed Consent: I have reviewed the patients History and Physical, chart, labs and discussed the procedure including the risks, benefits and alternatives for the proposed anesthesia with the patient or authorized representative who has indicated his/her understanding and acceptance.     Plan Discussed with: CRNA and Surgeon  Anesthesia Plan Comments:         Anesthesia Quick Evaluation

## 2011-11-17 ENCOUNTER — Encounter (HOSPITAL_COMMUNITY): Payer: Self-pay | Admitting: Neurological Surgery

## 2011-11-17 MED ORDER — HYDROCODONE-ACETAMINOPHEN 5-325 MG PO TABS: 1.0000 | ORAL_TABLET | ORAL | Status: AC | PRN

## 2011-11-17 NOTE — Discharge Summary (Signed)
Physician Discharge Summary  Patient ID: Jessica Mclaughlin MRN: [id number] DOB/AGE: January 22, 1961 51 y.o.  Admit date: 11/16/2011 Discharge date: 11/17/2011  Admission Diagnoses: lumbar stenosis    Discharge Diagnoses: same   Discharged Condition: good  Hospital Course: The patient was admitted on 11/16/2011 and taken to the operating room where the patient underwent DLL L4-5 L5-S1. The patient tolerated the procedure well and was taken to the recovery room and then to the floor in stable condition. The hospital course was routine. There were no complications. The wound remained clean dry and intact. Pt had appropriate back soreness. No complaints of leg pain or new N/T/W. The patient remained afebrile with stable vital signs, and tolerated a regular diet. The patient continued to increase activities, and pain was well controlled with oral pain medications.   Consults: None  Significant Diagnostic Studies:  Results for orders placed during the hospital encounter of 11/09/11  APTT      Component Value Range   aPTT 26  24 - 37 (seconds)  BASIC METABOLIC PANEL      Component Value Range   Sodium 139  135 - 145 (mEq/L)   Potassium 4.5  3.5 - 5.1 (mEq/L)   Chloride 105  96 - 112 (mEq/L)   CO2 26  19 - 32 (mEq/L)   Glucose, Bld 89  70 - 99 (mg/dL)   BUN 12  6 - 23 (mg/dL)   Creatinine, Ser 4.09  0.50 - 1.10 (mg/dL)   Calcium 9.2  8.4 - 81.1 (mg/dL)   GFR calc non Af Amer 70 (*) >90 (mL/min)   GFR calc Af Amer 81 (*) >90 (mL/min)  CBC      Component Value Range   WBC 5.4  4.0 - 10.5 (K/uL)   RBC 4.47  3.87 - 5.11 (MIL/uL)   Hemoglobin 12.4  12.0 - 15.0 (g/dL)   HCT 91.4  78.2 - 95.6 (%)   MCV 82.6  78.0 - 100.0 (fL)   MCH 27.7  26.0 - 34.0 (pg)   MCHC 33.6  30.0 - 36.0 (g/dL)   RDW 21.3  08.6 - 57.8 (%)   Platelets 283  150 - 400 (K/uL)  DIFFERENTIAL      Component Value Range   Neutrophils Relative 55  43 - 77 (%)   Neutro Abs 3.0  1.7 - 7.7 (K/uL)   Lymphocytes Relative 37  12  - 46 (%)   Lymphs Abs 2.0  0.7 - 4.0 (K/uL)   Monocytes Relative 6  3 - 12 (%)   Monocytes Absolute 0.3  0.1 - 1.0 (K/uL)   Eosinophils Relative 2  0 - 5 (%)   Eosinophils Absolute 0.1  0.0 - 0.7 (K/uL)   Basophils Relative 0  0 - 1 (%)   Basophils Absolute 0.0  0.0 - 0.1 (K/uL)  PROTIME-INR      Component Value Range   Prothrombin Time 13.2  11.6 - 15.2 (seconds)   INR 0.98  0.00 - 1.49   HCG, SERUM, QUALITATIVE      Component Value Range   Preg, Serum NEGATIVE  NEGATIVE   SURGICAL PCR SCREEN      Component Value Range   MRSA, PCR NEGATIVE  NEGATIVE    Staphylococcus aureus NEGATIVE  NEGATIVE     Dg Chest 2 View  11/09/2011  *RADIOLOGY REPORT*  Clinical Data: Ex-smoker for lumbar laminectomy.  No acute chest complaints.  CHEST - 2 VIEW  Comparison: 06/28/2010.  Findings: Overall, there are lower  lung volumes.  Asymmetric mild elevation of the right hemidiaphragm appears stable.  There is mild bibasilar atelectasis.  No edema, confluent airspace opacity or significant pleural effusion is seen.  Heart size and mediastinal contours are stable.  The patient has undergone interval lower cervical fusion.  IMPRESSION: Mild basilar atelectasis subsequent to the lesser inspiration.  No acute cardiopulmonary process.  Original Report Authenticated By: Gerrianne Scale, M.D.   Dg Lumbar Spine 1 View  11/16/2011  *RADIOLOGY REPORT*  Clinical Data: Lumbar decompression.  L4-5 and L5-S1 laminectomies.  LUMBAR SPINE - 1 VIEW  Comparison: Lumbar spine MRI from 05/12/2011.  Findings: Same numbering scheme is used on today's study as was employed on the previous MRI.  This places the caudal most lumbar- type vertebral body at the L5 level.  Soft tissue retractors are noted in the posterior lower back.  A surgical probe tip it is positioned at the level of the L4-5 facets.  IMPRESSION: Intraoperative localization.  Original Report Authenticated By: ERIC A. MANSELL, M.D.   Mm Digital Screening  11/15/2011   DG SCREEN MAMMOGRAM BILATERAL Bilateral CC and MLO view(s) were taken.  DIGITAL SCREENING MAMMOGRAM WITH CAD: There are scattered fibroglandular densities.  No masses or malignant type calcifications are  identified.  Compared with prior studies.  Images were processed with CAD.  IMPRESSION: No specific mammographic evidence of malignancy.  Next screening mammogram is recommended in one  year.  A result letter of this screening mammogram will be mailed directly to the patient.  ASSESSMENT: Negative - BI-RADS 1  Screening mammogram in 1 year. ,    Antibiotics:  Anti-infectives     Start     Dose/Rate Route Frequency Ordered Stop   11/16/11 1700   ceFAZolin (ANCEF) IVPB 1 g/50 mL premix        1 g 100 mL/hr over 30 Minutes Intravenous Every 8 hours 11/16/11 1259 11/17/11 0134   11/16/11 0925   bacitracin 50,000 Units in sodium chloride irrigation 0.9 % 500 mL irrigation  Status:  Discontinued          As needed 11/16/11 0926 11/16/11 1055   11/15/11 1415   ceFAZolin (ANCEF) IVPB 1 g/50 mL premix  Status:  Discontinued     Comments: To OR      1 g 100 mL/hr over 30 Minutes Intravenous  Once 11/15/11 1406 11/15/11 1407   11/15/11 1415   ceFAZolin (ANCEF) IVPB 2 g/50 mL premix     Comments: To OR      2 g 100 mL/hr over 30 Minutes Intravenous  Once 11/15/11 1407 11/16/11 0845          Discharge Exam: Blood pressure 92/54, pulse 63, temperature 97.9 F (36.6 C), temperature source Oral, resp. rate 16, SpO2 94.00%. Neurologic: Grossly normal Incision ok  Discharge Medications:   Medication List  As of 11/17/2011  8:06 AM   TAKE these medications         CONJUGATED LINOLEIC ACID PO   Take 2 tablets by mouth daily. DIETARY SUPPLEMENT      HYDROcodone-acetaminophen 5-325 MG per tablet   Commonly known as: NORCO   Take 1-2 tablets by mouth every 4 (four) hours as needed for pain.      ibuprofen 800 MG tablet   Commonly known as: ADVIL,MOTRIN   Take 800 mg by mouth every 6 (six)  hours as needed. Leg pain      ibuprofen 200 MG tablet   Commonly known as: ADVIL,MOTRIN  Take 200 mg by mouth every 8 (eight) hours as needed. LEG PAIN            Disposition: home   Final Dx: DLL L4-5 L5-S1  Discharge Orders    Future Orders Please Complete By Expires   Diet - low sodium heart healthy      Increase activity slowly      Remove dressing in 48 hours      Call MD for:  temperature >100.4      Call MD for:  persistant nausea and vomiting      Call MD for:  severe uncontrolled pain      Call MD for:  redness, tenderness, or signs of infection (pain, swelling, redness, odor or green/yellow discharge around incision site)         Follow-up Information    Follow up with Fortune Brannigan S, MD. Schedule an appointment as soon as possible for a visit in 2 weeks.   Contact information:   [address] [place] [address] [phone]           Signed: Tia Alert 11/17/2011, 8:06 AM

## 2011-11-17 NOTE — Discharge Instructions (Signed)
Wound Care Keep incision covered and dry for one week.  If you shower prior to then, cover incision with plastic wrap.  You may remove outer bandage after one week and shower.  Do not put any creams, lotions, or ointments on incision. Leave steri-strips on back.  They will fall off by themselves. Activity Walk each and every day, increasing distance each day. No lifting greater than 5 lbs.  Avoid bending, arching, or twisting. No driving for 2 weeks; may ride as a passenger locally. If provided with back brace, wear when out of bed.  It is not necessary to wear in bed. Diet Resume your normal diet.  Return to Work Will be discussed at you follow up appointment. Call Your Doctor If Any of These Occur Redness, drainage, or swelling at the wound.  Temperature greater than 101 degrees. Severe pain not relieved by pain medication. Incision starts to come apart. Follow Up Appt Call today for appointment in 1-2 weeks ([phone]) or for problems.  If you have any hardware placed in your spine, you will need an x-ray before your appointment. 

## 2012-10-23 ENCOUNTER — Other Ambulatory Visit (HOSPITAL_COMMUNITY): Payer: Self-pay | Admitting: Family Medicine

## 2012-10-23 DIAGNOSIS — Z1231 Encounter for screening mammogram for malignant neoplasm of breast: Secondary | ICD-10-CM

## 2012-11-08 ENCOUNTER — Other Ambulatory Visit: Payer: Self-pay | Admitting: Family Medicine

## 2012-11-08 ENCOUNTER — Other Ambulatory Visit (HOSPITAL_COMMUNITY)
Admission: RE | Admit: 2012-11-08 | Discharge: 2012-11-08 | Disposition: A | Discharge status: Home / Self Care | Payer: Managed Care, Other (non HMO) | Source: Ambulatory Visit | Attending: Family Medicine | Admitting: Family Medicine

## 2012-11-08 DIAGNOSIS — Z0000 Encounter for general adult medical examination without abnormal findings: Secondary | ICD-10-CM | POA: Insufficient documentation

## 2012-11-14 ENCOUNTER — Ambulatory Visit (HOSPITAL_COMMUNITY)
Admission: RE | Admit: 2012-11-14 | Discharge: 2012-11-14 | Disposition: A | Discharge status: Home / Self Care | Payer: Managed Care, Other (non HMO) | Source: Ambulatory Visit | Attending: Family Medicine | Admitting: Family Medicine

## 2012-11-14 DIAGNOSIS — Z1231 Encounter for screening mammogram for malignant neoplasm of breast: Secondary | ICD-10-CM | POA: Insufficient documentation

## 2013-09-26 ENCOUNTER — Other Ambulatory Visit: Payer: Self-pay

## 2013-09-26 DIAGNOSIS — Z1231 Encounter for screening mammogram for malignant neoplasm of breast: Secondary | ICD-10-CM

## 2013-11-18 ENCOUNTER — Encounter (INDEPENDENT_AMBULATORY_CARE_PROVIDER_SITE_OTHER): Payer: Self-pay

## 2013-11-18 ENCOUNTER — Ambulatory Visit
Admission: RE | Admit: 2013-11-18 | Discharge: 2013-11-18 | Disposition: A | Discharge status: Home / Self Care | Payer: Managed Care, Other (non HMO) | Source: Ambulatory Visit

## 2013-11-18 DIAGNOSIS — Z1231 Encounter for screening mammogram for malignant neoplasm of breast: Secondary | ICD-10-CM

## 2013-12-02 ENCOUNTER — Other Ambulatory Visit: Payer: Self-pay | Admitting: Family Medicine

## 2013-12-02 ENCOUNTER — Other Ambulatory Visit (HOSPITAL_COMMUNITY)
Admission: RE | Admit: 2013-12-02 | Discharge: 2013-12-02 | Disposition: A | Discharge status: Home / Self Care | Payer: Managed Care, Other (non HMO) | Source: Ambulatory Visit | Attending: Family Medicine | Admitting: Family Medicine

## 2013-12-02 DIAGNOSIS — Z01419 Encounter for gynecological examination (general) (routine) without abnormal findings: Secondary | ICD-10-CM | POA: Insufficient documentation

## 2014-10-29 ENCOUNTER — Other Ambulatory Visit: Payer: Self-pay

## 2014-10-29 DIAGNOSIS — Z1231 Encounter for screening mammogram for malignant neoplasm of breast: Secondary | ICD-10-CM

## 2014-11-28 ENCOUNTER — Ambulatory Visit
Admission: RE | Admit: 2014-11-28 | Discharge: 2014-11-28 | Disposition: A | Discharge status: Home / Self Care | Payer: 59 | Source: Ambulatory Visit

## 2014-11-28 ENCOUNTER — Encounter (INDEPENDENT_AMBULATORY_CARE_PROVIDER_SITE_OTHER): Payer: Self-pay

## 2014-11-28 DIAGNOSIS — Z1231 Encounter for screening mammogram for malignant neoplasm of breast: Secondary | ICD-10-CM

## 2015-10-21 ENCOUNTER — Other Ambulatory Visit: Payer: Self-pay

## 2015-10-21 DIAGNOSIS — Z1231 Encounter for screening mammogram for malignant neoplasm of breast: Secondary | ICD-10-CM

## 2015-11-30 ENCOUNTER — Ambulatory Visit
Admission: RE | Admit: 2015-11-30 | Discharge: 2015-11-30 | Disposition: A | Discharge status: Home / Self Care | Payer: Managed Care, Other (non HMO) | Source: Ambulatory Visit

## 2015-11-30 DIAGNOSIS — Z1231 Encounter for screening mammogram for malignant neoplasm of breast: Secondary | ICD-10-CM

## 2016-09-28 ENCOUNTER — Other Ambulatory Visit: Payer: Self-pay | Admitting: Family Medicine

## 2016-09-28 DIAGNOSIS — Z1231 Encounter for screening mammogram for malignant neoplasm of breast: Secondary | ICD-10-CM

## 2016-11-30 ENCOUNTER — Ambulatory Visit
Admission: RE | Admit: 2016-11-30 | Discharge: 2016-11-30 | Disposition: A | Discharge status: Home / Self Care | Payer: Managed Care, Other (non HMO) | Source: Ambulatory Visit | Attending: Family Medicine | Admitting: Family Medicine

## 2016-11-30 DIAGNOSIS — Z1231 Encounter for screening mammogram for malignant neoplasm of breast: Secondary | ICD-10-CM

## 2017-01-02 ENCOUNTER — Other Ambulatory Visit (HOSPITAL_COMMUNITY)
Admission: RE | Admit: 2017-01-02 | Discharge: 2017-01-02 | Disposition: A | Discharge status: Home / Self Care | Payer: Managed Care, Other (non HMO) | Source: Ambulatory Visit | Attending: Family Medicine | Admitting: Family Medicine

## 2017-01-02 ENCOUNTER — Other Ambulatory Visit: Payer: Self-pay | Admitting: Family Medicine

## 2017-01-02 DIAGNOSIS — Z124 Encounter for screening for malignant neoplasm of cervix: Secondary | ICD-10-CM | POA: Diagnosis not present

## 2017-01-03 LAB — CYTOLOGY - PAP: DIAGNOSIS: NEGATIVE

## 2017-10-05 ENCOUNTER — Other Ambulatory Visit: Payer: Self-pay | Admitting: Family Medicine

## 2017-10-05 DIAGNOSIS — Z1231 Encounter for screening mammogram for malignant neoplasm of breast: Secondary | ICD-10-CM

## 2017-12-01 ENCOUNTER — Ambulatory Visit
Admission: RE | Admit: 2017-12-01 | Discharge: 2017-12-01 | Disposition: A | Discharge status: Home / Self Care | Payer: Managed Care, Other (non HMO) | Source: Ambulatory Visit | Attending: Family Medicine | Admitting: Family Medicine

## 2017-12-01 DIAGNOSIS — Z1231 Encounter for screening mammogram for malignant neoplasm of breast: Secondary | ICD-10-CM

## 2019-01-08 ENCOUNTER — Other Ambulatory Visit: Payer: Self-pay | Admitting: Family Medicine

## 2019-01-08 DIAGNOSIS — Z1231 Encounter for screening mammogram for malignant neoplasm of breast: Secondary | ICD-10-CM

## 2019-02-13 ENCOUNTER — Other Ambulatory Visit: Payer: Self-pay

## 2019-02-13 ENCOUNTER — Ambulatory Visit
Admission: RE | Admit: 2019-02-13 | Discharge: 2019-02-13 | Disposition: A | Discharge status: Home / Self Care | Payer: BC Managed Care – PPO | Source: Ambulatory Visit | Attending: Family Medicine | Admitting: Family Medicine

## 2019-02-13 DIAGNOSIS — Z1231 Encounter for screening mammogram for malignant neoplasm of breast: Secondary | ICD-10-CM

## 2019-02-23 ENCOUNTER — Other Ambulatory Visit: Payer: Self-pay

## 2019-02-23 DIAGNOSIS — Z20822 Contact with and (suspected) exposure to covid-19: Secondary | ICD-10-CM

## 2019-02-24 LAB — NOVEL CORONAVIRUS, NAA: SARS-CoV-2, NAA: NOT DETECTED

## 2019-03-18 ENCOUNTER — Encounter: Payer: Self-pay | Admitting: Emergency Medicine

## 2019-03-18 ENCOUNTER — Other Ambulatory Visit: Payer: Self-pay

## 2019-03-18 ENCOUNTER — Ambulatory Visit (INDEPENDENT_AMBULATORY_CARE_PROVIDER_SITE_OTHER): Payer: BC Managed Care – PPO

## 2019-03-18 ENCOUNTER — Ambulatory Visit
Admission: EM | Admit: 2019-03-18 | Discharge: 2019-03-18 | Disposition: A | Discharge status: Home / Self Care | Payer: BC Managed Care – PPO | Attending: Emergency Medicine | Admitting: Emergency Medicine

## 2019-03-18 DIAGNOSIS — W109XXA Fall (on) (from) unspecified stairs and steps, initial encounter: Secondary | ICD-10-CM | POA: Diagnosis not present

## 2019-03-18 DIAGNOSIS — S93401A Sprain of unspecified ligament of right ankle, initial encounter: Secondary | ICD-10-CM | POA: Diagnosis not present

## 2019-03-18 NOTE — ED Notes (Signed)
Patient able to ambulate independently  

## 2019-03-18 NOTE — ED Triage Notes (Signed)
Pt presents to [facility] for assessment of right ankle pain after missing a step and falling all the way to the ground.  C/o external ankle pain since, but today developed medial ankle pain.

## 2019-03-18 NOTE — Discharge Instructions (Signed)
May ice, rest, elevate area is causing most pain.  Can also use hot compresses/warm wash rags to relieve muscle tightness. May use OTC Tylenol, ibuprofen as needed for pain. Return if you develop worsening pain, chest pain, difficulty breathing.

## 2019-03-18 NOTE — ED Provider Notes (Signed)
[facility]    CSN: [id number] Arrival date & time: 03/18/19  1757      History   Chief Complaint Chief Complaint  Patient presents with  . Fall  . Foot Pain    HPI Jessica Mclaughlin is a 58 y.o. female presenting for right ankle pain after falling yesterday.  Patient states that she missed a step, impacted her heel hard and possibly to sit her ankle.  Patient denies head trauma, LOC.  Patient noticed lateral ankle pain immediately after fall, though has developed medial ankle pain today.  She has tried Tylenol, ibuprofen with moderate relief of pain.   Past Medical History:  Diagnosis Date  . Bronchitis    hx of  . Gallstones    hx of  . Pneumonia     There are no active problems to display for this patient.   Past Surgical History:  Procedure Laterality Date  . CERVICAL FUSION     "and plating"  . CESAREAN SECTION     x2  . CHOLECYSTECTOMY    . LUMBAR LAMINECTOMY/DECOMPRESSION MICRODISCECTOMY  11/16/2011   Procedure: LUMBAR LAMINECTOMY/DECOMPRESSION MICRODISCECTOMY 2 LEVELS;  Surgeon: Tia Alertavid S Jones, MD;  Location: [facility];  Service: Neurosurgery;  Laterality: N/A;  Lumbar Laminectomy Decompression Microdiscectomy Lumbar Four-Five Lumbar Five-Sacral One  . TUBAL LIGATION      OB History   No obstetric history on file.      Home Medications    Prior to Admission medications   Medication Sig Start Date End Date Taking? Authorizing Provider  ibuprofen (ADVIL,MOTRIN) 200 MG tablet Take 200 mg by mouth every 8 (eight) hours as needed. LEG PAIN    [provider]  ibuprofen (ADVIL,MOTRIN) 800 MG tablet Take 800 mg by mouth every 6 (six) hours as needed. Leg pain    [provider]  Linoleic Acid Conjugated (CONJUGATED LINOLEIC ACID PO) Take 2 tablets by mouth daily. DIETARY SUPPLEMENT    [provider]    Family History History reviewed. No pertinent family history.  Social History Social History   Tobacco Use  .  Smoking status: Former Smoker    Quit date: 06/23/2010    Years since quitting: 8.7  . Smokeless tobacco: Never Used  Substance Use Topics  . Alcohol use: No  . Drug use: No     Allergies   Patient has no known allergies.   Review of Systems Review of Systems  Constitutional: Negative for fatigue and fever.  Respiratory: Negative for cough and shortness of breath.   Cardiovascular: Negative for chest pain and palpitations.  Musculoskeletal:       Positive for right ankle pain and swelling Negative for weakness, deformity  Neurological: Negative for weakness and numbness.     Physical Exam Triage Vital Signs ED Triage Vitals [03/18/19 1803]  Enc Vitals Group     BP      Pulse      Resp      Temp      Temp src      SpO2      Weight      Height      Head Circumference      Peak Flow      Pain Score 8     Pain Loc      Pain Edu?      Excl. in GC?    No data found.  Updated Vital Signs BP (!) 156/90 (BP Location: Left Arm)   Pulse  72   Temp 98.3 F (36.8 C) (Oral)   Resp 16   LMP 10/19/2012   SpO2 98%   Visual Acuity Right Eye Distance:   Left Eye Distance:   Bilateral Distance:    Right Eye Near:   Left Eye Near:    Bilateral Near:     Physical Exam Constitutional:      General: She is not in acute distress. HENT:     Head: Normocephalic and atraumatic.  Eyes:     General: No scleral icterus.    Conjunctiva/sclera: Conjunctivae normal.     Pupils: Pupils are equal, round, and reactive to light.  Cardiovascular:     Rate and Rhythm: Normal rate.  Pulmonary:     Effort: Pulmonary effort is normal. No respiratory distress.  Musculoskeletal:     Comments: Right ankle with significant edema that is nonpitting and without erythema or ecchymosis.  No lateral or medial malleoli tenderness, the patient is diffusely tender throughout proximal foot dorsum.  Decreased active ROM second to pain/swelling.  Neurovascularly intact.  Gait is antalgic,  favoring right.  Neurological:     General: No focal deficit present.     Mental Status: She is alert.      UC Treatments / Results  Labs (all labs ordered are listed, but only abnormal results are displayed) Labs Reviewed - No data to display  EKG   Radiology Dg Ankle Complete Right  Result Date: 03/18/2019 CLINICAL DATA:  Fall, pain, swelling EXAM: RIGHT ANKLE - COMPLETE 3+ VIEW COMPARISON:  None. FINDINGS: Lateral soft tissue swelling. Joint spaces are maintained. No acute bony abnormality. Specifically, no fracture, subluxation, or dislocation. IMPRESSION: No acute bony abnormality. Electronically Signed   By: Rolm Baptise M.D.   On: 03/18/2019 18:38    Procedures Procedures (including critical care time)  Medications Ordered in UC Medications - No data to display  Initial Impression / Assessment and Plan / UC Course  I have reviewed the triage vital signs and the nursing notes.  Pertinent labs & imaging results that were available during my care of the patient were reviewed by me and considered in my medical decision making (see chart for details).     1.  Moderate right ankle sprain X-ray done in office, reviewed by me and radiology: No acute bony abnormality.  Provided ASO brace, crutches to aid in ambulation and provide compression/support for right ankle sprain.  Will treat supportively as listed below, have patient follow-up with sports medicine in 1 week should symptoms not improve/worsen.  Return precautions discussed, patient verbalized understanding and is agreeable to plan. Final Clinical Impressions(s) / UC Diagnoses   Final diagnoses:  Moderate right ankle sprain, initial encounter     Discharge Instructions     May ice, rest, elevate area is causing most pain.  Can also use hot compresses/warm wash rags to relieve muscle tightness. May use OTC Tylenol, ibuprofen as needed for pain. Return if you develop worsening pain, chest pain, difficulty  breathing.    ED Prescriptions    None     Controlled Substance Prescriptions [place] Controlled Substance Registry consulted? Not Applicable   Quincy Sheehan, [place] 03/19/19 1027

## 2019-08-08 ENCOUNTER — Ambulatory Visit: Admission: EM | Admit: 2019-08-08 | Discharge: 2019-08-08 | Discharge status: Absent without leave | Payer: 59

## 2020-01-06 ENCOUNTER — Other Ambulatory Visit: Payer: Self-pay | Admitting: Family Medicine

## 2020-01-06 DIAGNOSIS — Z1231 Encounter for screening mammogram for malignant neoplasm of breast: Secondary | ICD-10-CM

## 2020-02-05 ENCOUNTER — Other Ambulatory Visit: Payer: Self-pay | Admitting: Family Medicine

## 2020-02-05 ENCOUNTER — Other Ambulatory Visit (HOSPITAL_COMMUNITY)
Admission: RE | Admit: 2020-02-05 | Discharge: 2020-02-05 | Disposition: A | Discharge status: Home / Self Care | Payer: No Typology Code available for payment source | Source: Ambulatory Visit | Attending: Family Medicine | Admitting: Family Medicine

## 2020-02-05 DIAGNOSIS — Z124 Encounter for screening for malignant neoplasm of cervix: Secondary | ICD-10-CM | POA: Insufficient documentation

## 2020-02-07 LAB — CYTOLOGY - PAP
Comment: NEGATIVE
Diagnosis: NEGATIVE
High risk HPV: NEGATIVE

## 2020-02-14 ENCOUNTER — Ambulatory Visit
Admission: RE | Admit: 2020-02-14 | Discharge: 2020-02-14 | Disposition: A | Discharge status: Home / Self Care | Payer: No Typology Code available for payment source | Source: Ambulatory Visit | Attending: Family Medicine | Admitting: Family Medicine

## 2020-02-14 ENCOUNTER — Other Ambulatory Visit: Payer: Self-pay

## 2020-02-14 DIAGNOSIS — Z1231 Encounter for screening mammogram for malignant neoplasm of breast: Secondary | ICD-10-CM

## 2020-02-18 ENCOUNTER — Other Ambulatory Visit: Payer: Self-pay | Admitting: Family Medicine

## 2020-02-18 DIAGNOSIS — R928 Other abnormal and inconclusive findings on diagnostic imaging of breast: Secondary | ICD-10-CM

## 2020-02-24 ENCOUNTER — Ambulatory Visit
Admission: RE | Admit: 2020-02-24 | Discharge: 2020-02-24 | Disposition: A | Discharge status: Home / Self Care | Payer: No Typology Code available for payment source | Source: Ambulatory Visit | Attending: Family Medicine | Admitting: Family Medicine

## 2020-02-24 ENCOUNTER — Other Ambulatory Visit: Payer: Self-pay

## 2020-02-24 DIAGNOSIS — R928 Other abnormal and inconclusive findings on diagnostic imaging of breast: Secondary | ICD-10-CM

## 2020-06-02 ENCOUNTER — Ambulatory Visit
Admission: EM | Admit: 2020-06-02 | Discharge: 2020-06-02 | Disposition: A | Discharge status: Home / Self Care | Payer: No Typology Code available for payment source | Attending: Emergency Medicine | Admitting: Emergency Medicine

## 2020-06-02 DIAGNOSIS — M79601 Pain in right arm: Secondary | ICD-10-CM | POA: Diagnosis not present

## 2020-06-02 DIAGNOSIS — W19XXXA Unspecified fall, initial encounter: Secondary | ICD-10-CM

## 2020-06-02 MED ORDER — CYCLOBENZAPRINE HCL 5 MG PO TABS: 5.0000 mg | ORAL_TABLET | Freq: Two times a day (BID) | ORAL | 0 refills | Status: AC | PRN

## 2020-06-02 MED ORDER — NAPROXEN 500 MG PO TABS: 500.0000 mg | ORAL_TABLET | Freq: Two times a day (BID) | ORAL | 0 refills | Status: DC

## 2020-06-02 NOTE — Discharge Instructions (Addendum)

## 2020-06-02 NOTE — ED Provider Notes (Signed)
[facility]    CSN: [id number] Arrival date & time: 06/02/20  0847      History   Chief Complaint Chief Complaint  Patient presents with  . Shoulder Pain    HPI Jessica Mclaughlin is a 59 y.o. female  Presenting for right hand pain s/p fall.  Patient provides history: States she tripped over her dog early this morning when getting up to go to the bathroom.  Denies head trauma, LOC.  No other pain, chest pain, difficulty breathing, upper extremity numbness, neck pain.  Has not any for this.  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  Past Medical History:  Diagnosis Date  . Bronchitis    hx of  . Gallstones    hx of  . Pneumonia     There are no problems to display for this patient.   Past Surgical History:  Procedure Laterality Date  . CERVICAL FUSION     "and plating"  . CESAREAN SECTION     x2  . CHOLECYSTECTOMY    . LUMBAR LAMINECTOMY/DECOMPRESSION MICRODISCECTOMY  11/16/2011   Procedure: LUMBAR LAMINECTOMY/DECOMPRESSION MICRODISCECTOMY 2 LEVELS;  Surgeon: Tia Alert, MD;  Location: [facility];  Service: Neurosurgery;  Laterality: N/A;  Lumbar Laminectomy Decompression Microdiscectomy Lumbar Four-Five Lumbar Five-Sacral One  . TUBAL LIGATION      OB History   No obstetric history on file.      Home Medications    Prior to Admission medications   Medication Sig Start Date End Date Taking? Authorizing Provider  cyclobenzaprine (FLEXERIL) 5 MG tablet Take 1 tablet (5 mg total) by mouth 2 (two) times daily as needed for up to 7 days for muscle spasms. 06/02/20 06/09/20  Hall-Potvin, [place], PA-C  Linoleic Acid Conjugated (CONJUGATED LINOLEIC ACID PO) Take 2 tablets by mouth daily. DIETARY SUPPLEMENT    [provider]  naproxen (NAPROSYN) 500 MG tablet Take 1 tablet (500 mg total) by mouth 2 (two) times daily. 06/02/20    Hall-Potvin, [place], PA-C    Family History History reviewed. No pertinent family history.  Social History Social History   Tobacco Use  . Smoking status: Former Smoker    Quit date: 06/23/2010    Years since quitting: 9.9  . Smokeless tobacco: Never Used  Substance Use Topics  . Alcohol use: No  . Drug use: No     Allergies   Patient has no known allergies.   Review of Systems Review of Systems  Constitutional: Negative for fatigue and fever.  Respiratory: Negative for cough and shortness of breath.   Cardiovascular: Negative for chest pain and palpitations.  Musculoskeletal:       Positive for R arm pain  Neurological: Negative for weakness and numbness.     Physical Exam Triage Vital Signs ED Triage Vitals  Enc Vitals Group     BP      Pulse      Resp      Temp      Temp src      SpO2      Weight      Height      Head Circumference      Peak Flow      Pain Score      Pain Loc      Pain Edu?      Excl. in GC?  No data found.  Updated Vital Signs BP 129/82 (BP Location: Left Arm)   Pulse 72   Temp 98.4 F (36.9 C) (Oral)   Resp 18   LMP 10/19/2012   SpO2 97%   Visual Acuity Right Eye Distance:   Left Eye Distance:   Bilateral Distance:    Right Eye Near:   Left Eye Near:    Bilateral Near:     Physical Exam Constitutional:      General: She is not in acute distress. HENT:     Head: Normocephalic and atraumatic.  Eyes:     General: No scleral icterus.    Pupils: Pupils are equal, round, and reactive to light.  Cardiovascular:     Rate and Rhythm: Normal rate.  Pulmonary:     Effort: Pulmonary effort is normal.  Musculoskeletal:        General: No swelling, tenderness or deformity.     Comments: No bony tenderness or deformity of right clavicle, scapula, shoulder joint.  No elbow tenderness, swelling.  Neurovascularly intact.  Patient endorsing pain only when moving shoulder.  Skin:    Capillary Refill: Capillary refill  takes less than 2 seconds.     Coloration: Skin is not jaundiced or pale.     Findings: No bruising or rash.  Neurological:     General: No focal deficit present.     Mental Status: She is alert and oriented to person, place, and time.      UC Treatments / Results  Labs (all labs ordered are listed, but only abnormal results are displayed) Labs Reviewed - No data to display  EKG   Radiology No results found.  Procedures Procedures (including critical care time)  Medications Ordered in UC Medications - No data to display  Initial Impression / Assessment and Plan / UC Course  I have reviewed the triage vital signs and the nursing notes.  Pertinent labs & imaging results that were available during my care of the patient were reviewed by me and considered in my medical decision making (see chart for details).     For x-ray at this time given reassuring exam, mechanism of injury.  Patient requesting work note: Provided.  Reviewed supportive care as below.  Return precautions discussed, pt verbalized understanding and is agreeable to plan. Final Clinical Impressions(s) / UC Diagnoses   Final diagnoses:  Fall, initial encounter  Right arm pain     Discharge Instructions     RICE: rest, ice, compression, elevation as needed for pain.    Heat therapy (hot compress, warm wash rag, hot showers, etc.) can help relax muscles and soothe muscle aches. Cold therapy (ice packs) can be used to help swelling both after injury and after prolonged use of areas of chronic pain/aches.  Pain medication:  500 mg Naprosyn/Aleve (naproxen) every 12 hours with food:  AVOID other NSAIDs while taking this (may have Tylenol).  May take muscle relaxer as needed for severe pain / spasm.  (This medication may cause you to become tired so it is important you do not drink alcohol or operate heavy machinery while on this medication.  Recommend your first dose to be taken before bedtime to monitor for  side effects safely)  Important to follow up with specialist(s) below for further evaluation/management if your symptoms persist or worsen.    ED Prescriptions    Medication Sig Dispense Auth. Provider   naproxen (NAPROSYN) 500 MG tablet Take 1 tablet (500 mg total) by mouth 2 (  two) times daily. 30 tablet Hall-Potvin, [place], PA-C   cyclobenzaprine (FLEXERIL) 5 MG tablet Take 1 tablet (5 mg total) by mouth 2 (two) times daily as needed for up to 7 days for muscle spasms. 14 tablet Hall-Potvin, [place], PA-C     PDMP not reviewed this encounter.   Odette Fraction [place], [place] 06/02/20 [phone]

## 2020-06-02 NOTE — ED Triage Notes (Signed)
Pt states tripped over her dog this morning. Pt c/o rt shoulder pain. No deformity noted.

## 2020-08-01 ENCOUNTER — Ambulatory Visit: Payer: No Typology Code available for payment source | Attending: Internal Medicine

## 2020-08-01 DIAGNOSIS — Z23 Encounter for immunization: Secondary | ICD-10-CM

## 2020-08-01 NOTE — Progress Notes (Signed)
   Covid-19 Vaccination Clinic  Name:  INETTA DICKE    MRN: [id number] DOB: 21-May-1961  08/01/2020  Ms. Winzer was observed post Covid-19 immunization for 15 minutes without incident. She was provided with Vaccine Information Sheet and instruction to access the V-Safe system.   Ms. Kihn was instructed to call 911 with any severe reactions post vaccine: Marland Kitchen Difficulty breathing  . Swelling of face and throat  . A fast heartbeat  . A bad rash all over body  . Dizziness and weakness   Immunizations Administered    Name Date Dose VIS Date Route   Pfizer COVID-19 Vaccine 08/01/2020 11:25 AM 0.3 mL 05/13/2020 Intramuscular   Manufacturer: [employer], [employer]   Lot: [id number]   NDC: [address]-6

## 2021-01-14 ENCOUNTER — Other Ambulatory Visit: Payer: Self-pay | Admitting: Family Medicine

## 2021-01-14 DIAGNOSIS — Z1231 Encounter for screening mammogram for malignant neoplasm of breast: Secondary | ICD-10-CM

## 2021-02-26 IMAGING — US US BREAST*R* LIMITED INC AXILLA
1 series · 7 of 7 positions shown · non-contrast
Comparison: Previous exams.

CLINICAL DATA: Screening recall for possible right breast mass.

EXAM:
DIGITAL DIAGNOSTIC UNILATERAL RIGHT MAMMOGRAM WITH TOMO AND CAD;
ULTRASOUND RIGHT BREAST LIMITED

[Series 1: us breast*right* limited inc axilla · 0.07mm/px · 7 of 7 slices shown]
[im 1/7]
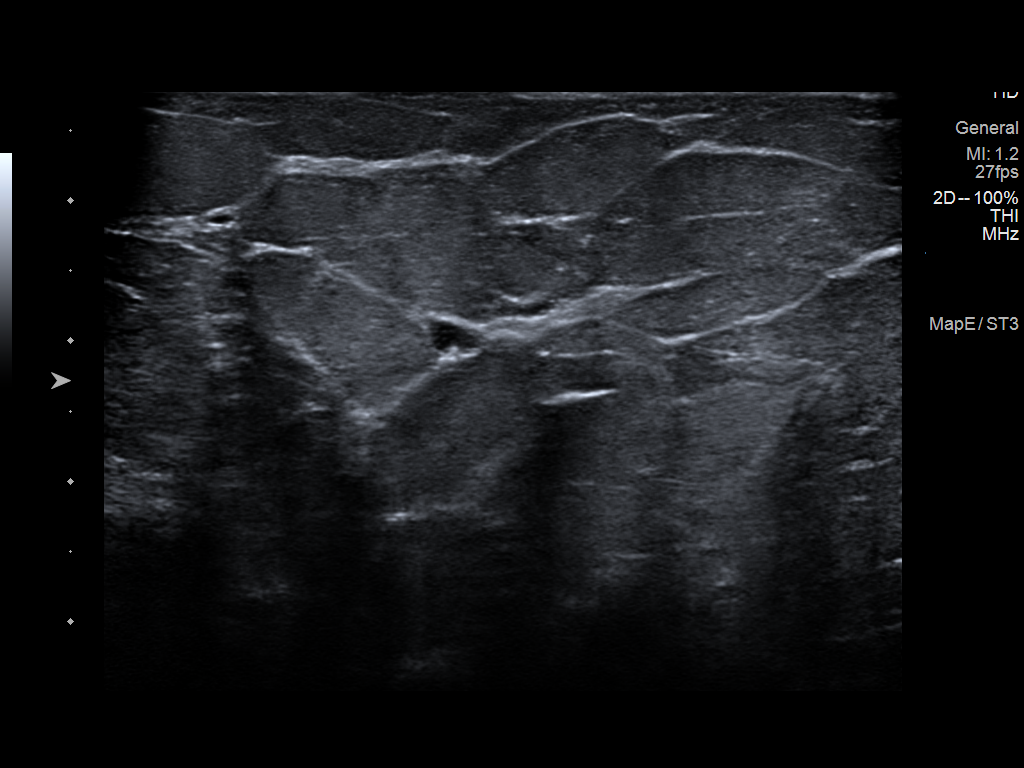
[im 2/7]
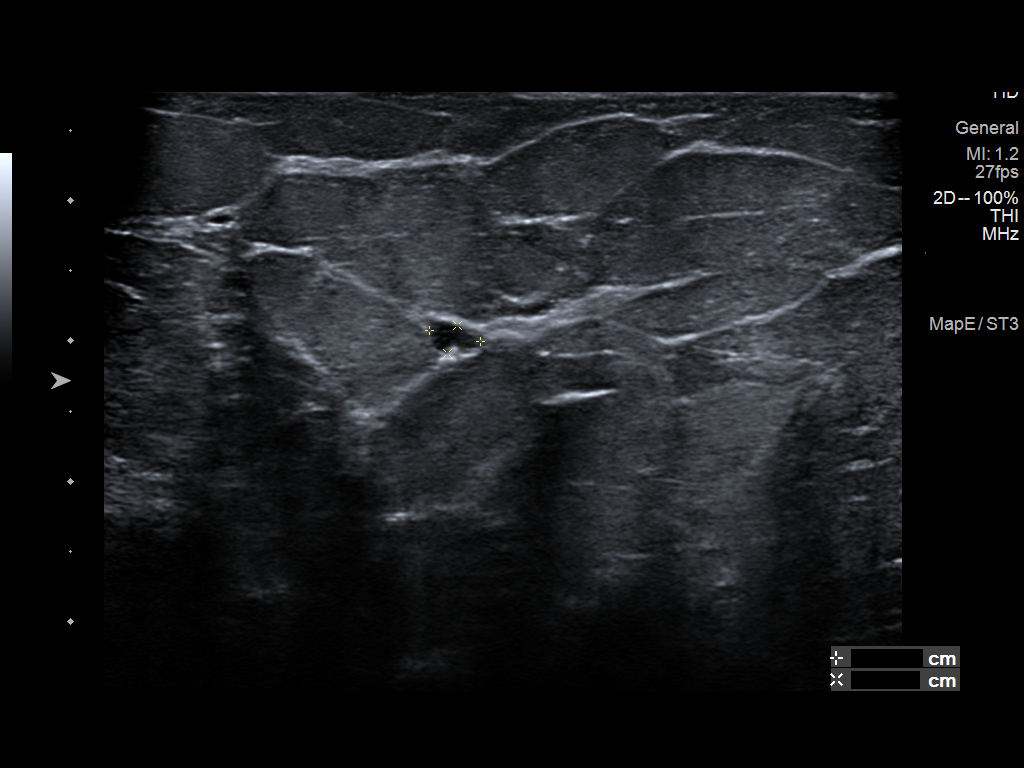
[im 3/7]
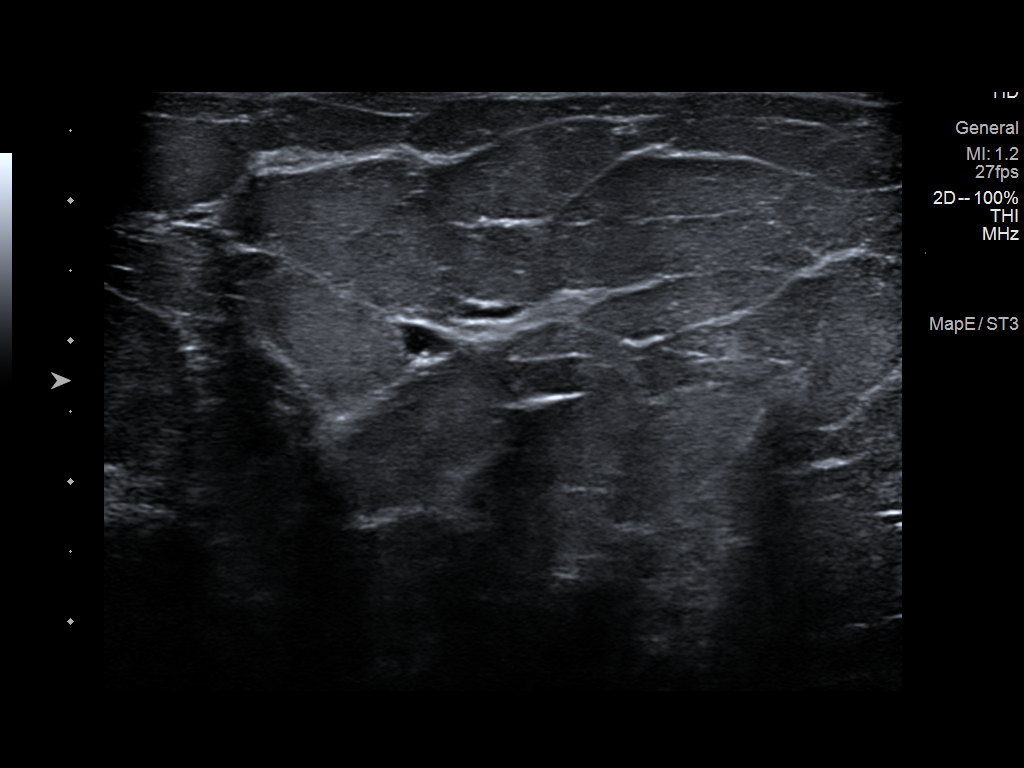
[im 4/7]
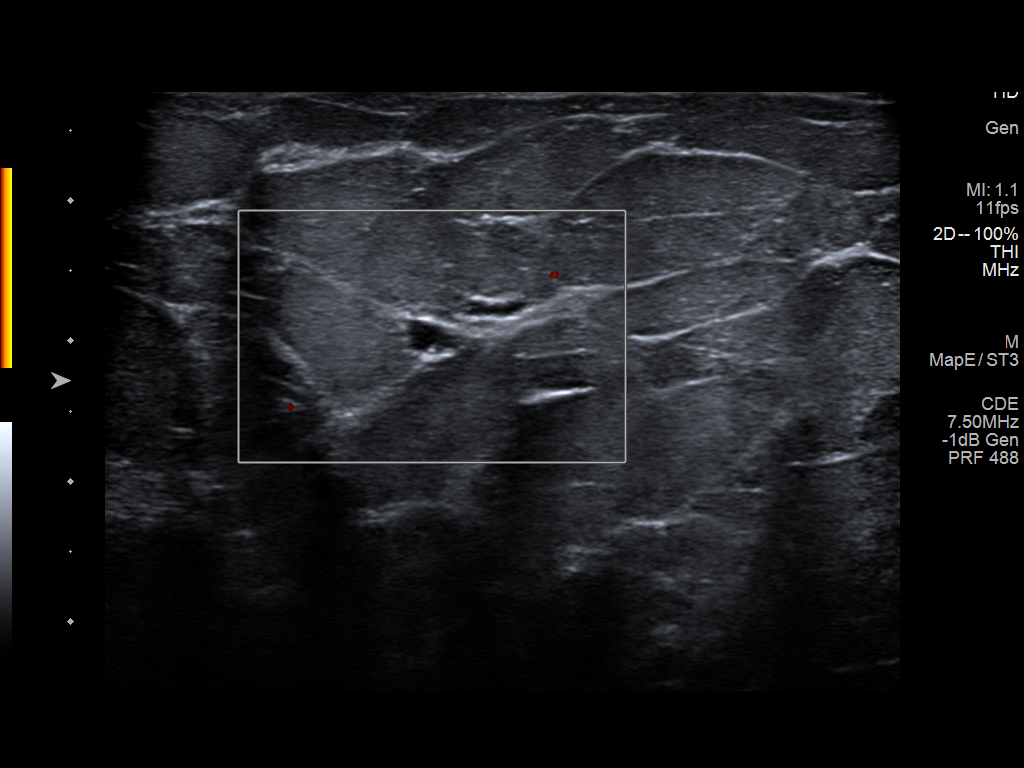
[im 5/7]
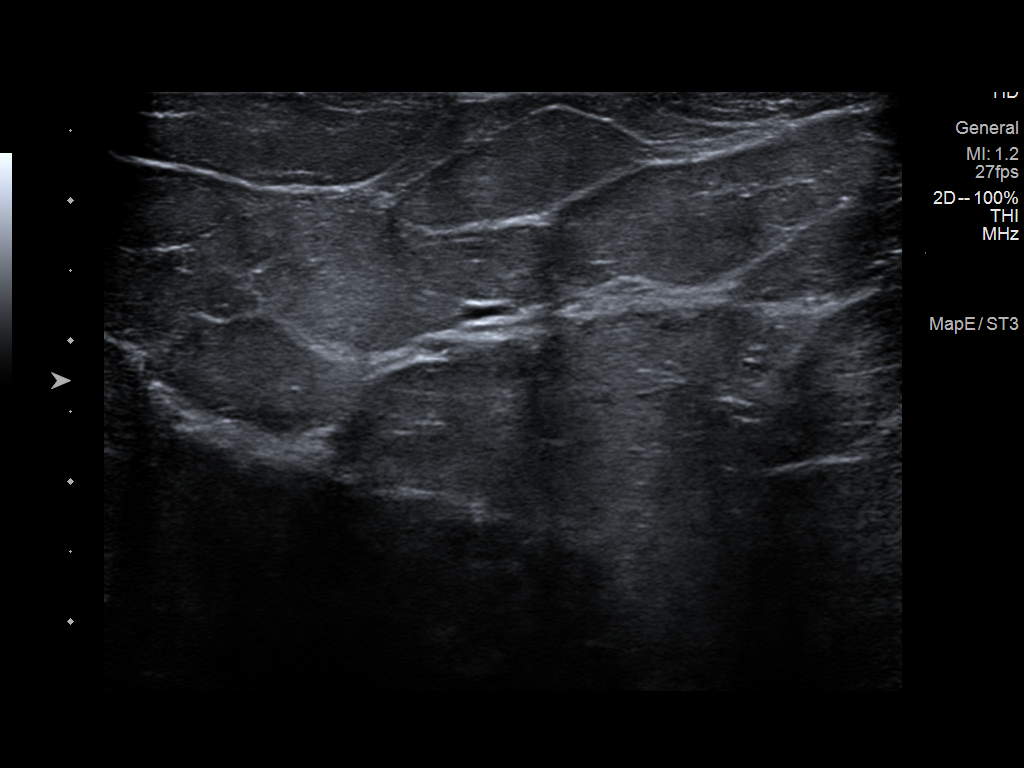
[im 6/7]
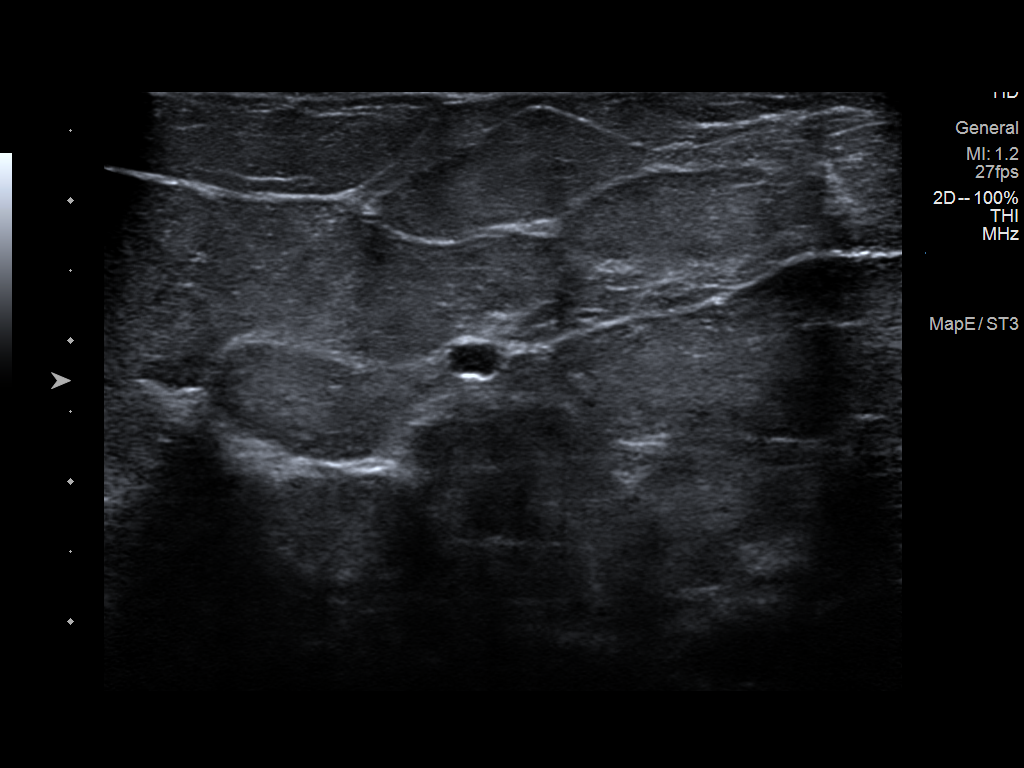
[im 7/7]
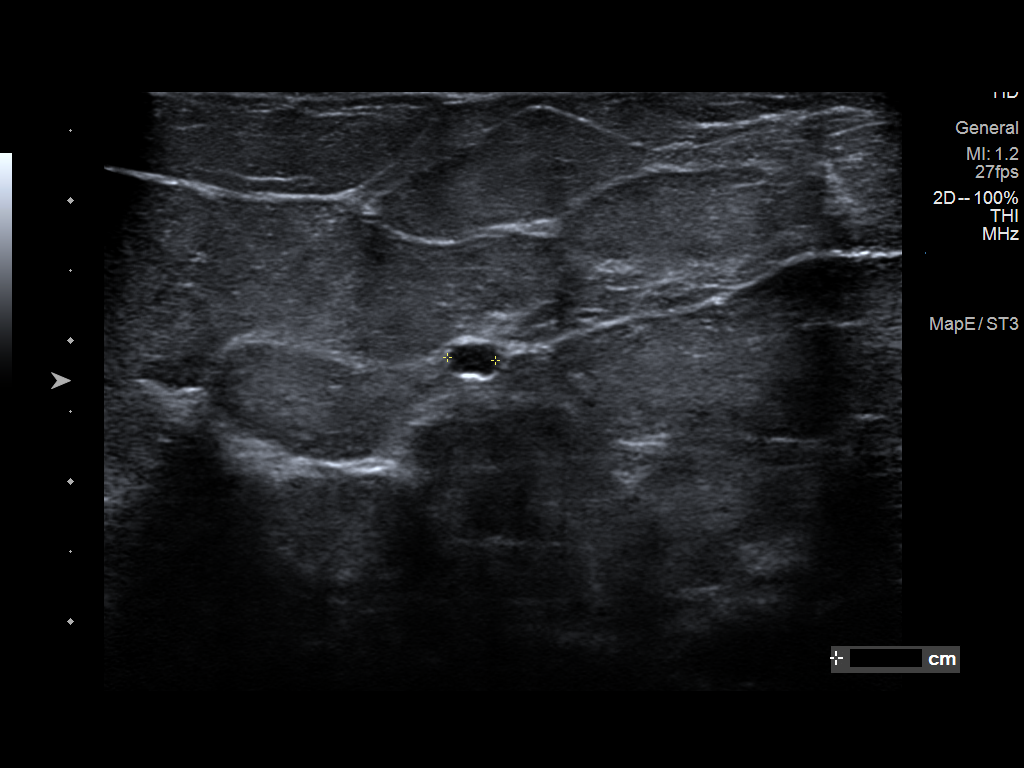

[7 of 7 positions shown; findings below may reference images not displayed]

ACR Breast Density Category b: There are scattered areas of
fibroglandular density.
FINDINGS: Cc and MLO tomograms were performed of the right breast. There is an
oval mass in the lower central right breast measuring approximately
0.4 cm.

Mammographic images were processed with CAD.

Targeted ultrasound the right breast was performed demonstrating a
small cyst at the [DATE] position 2 cm from nipple measuring 0.4 x
x 0.3 cm. This corresponds well with the mass seen in the right
breast at mammography.
IMPRESSION: No findings of malignancy in the right breast.

RECOMMENDATION:
Screening mammogram in one year.(Code:ZV-A-OCV)

I have discussed the findings and recommendations with the patient.
If applicable, a reminder letter will be sent to the patient
regarding the next appointment.

BI-RADS CATEGORY  2: Benign.

## 2021-02-26 IMAGING — MG MM DIGITAL DIAGNOSTIC UNILAT*R* W/ TOMO W/ CAD
4 series · 4 of 12 positions shown · non-contrast
Comparison: Previous exams.

CLINICAL DATA: Screening recall for possible right breast mass.

EXAM:
DIGITAL DIAGNOSTIC UNILATERAL RIGHT MAMMOGRAM WITH TOMO AND CAD;
ULTRASOUND RIGHT BREAST LIMITED

[R CC synth-2D]
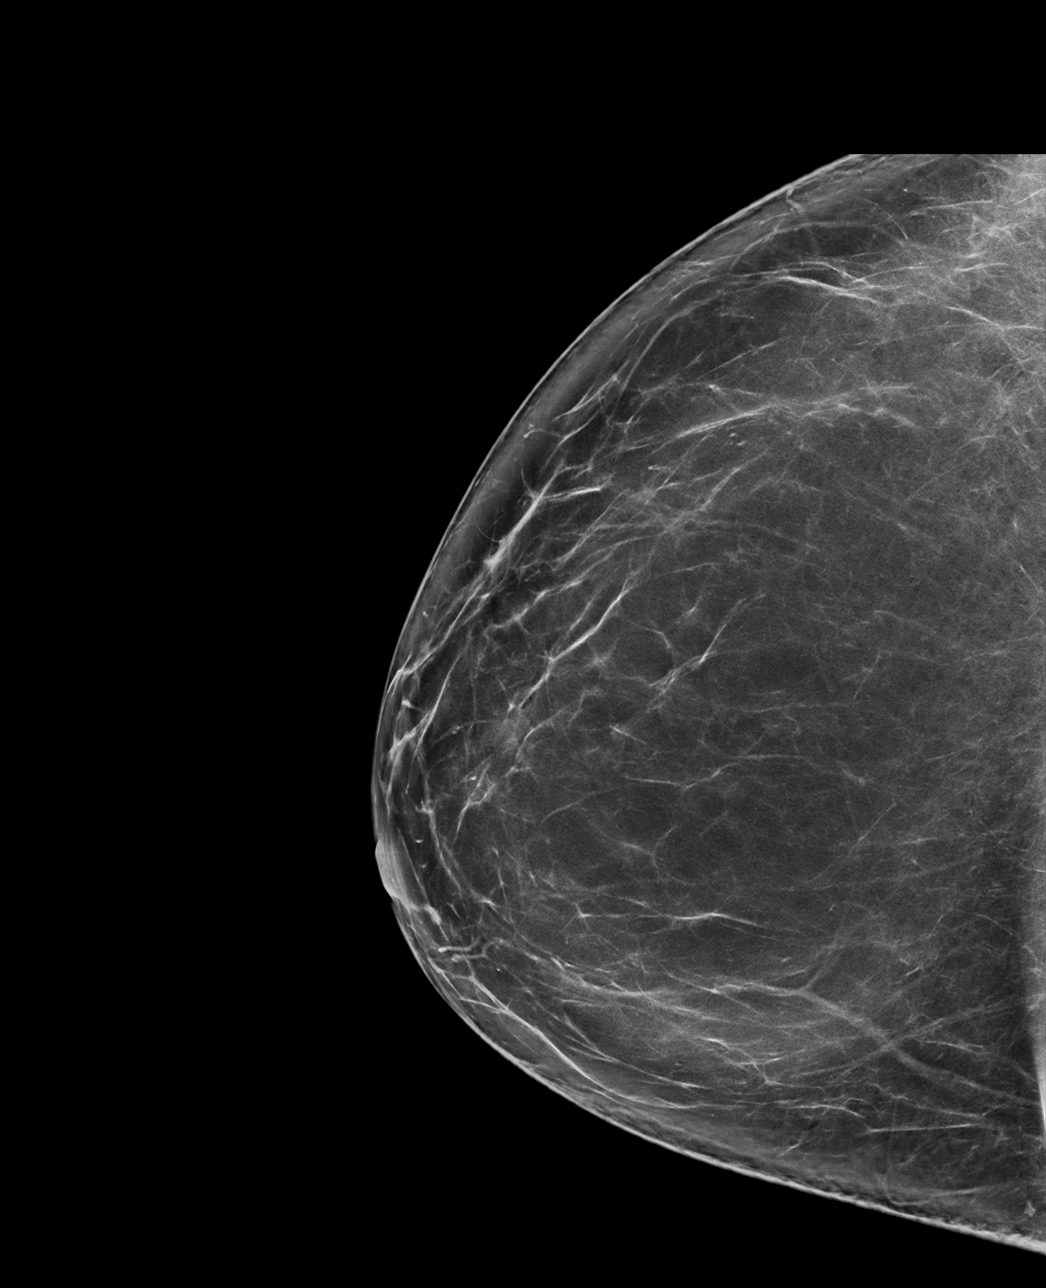

[R MLO synth-2D]
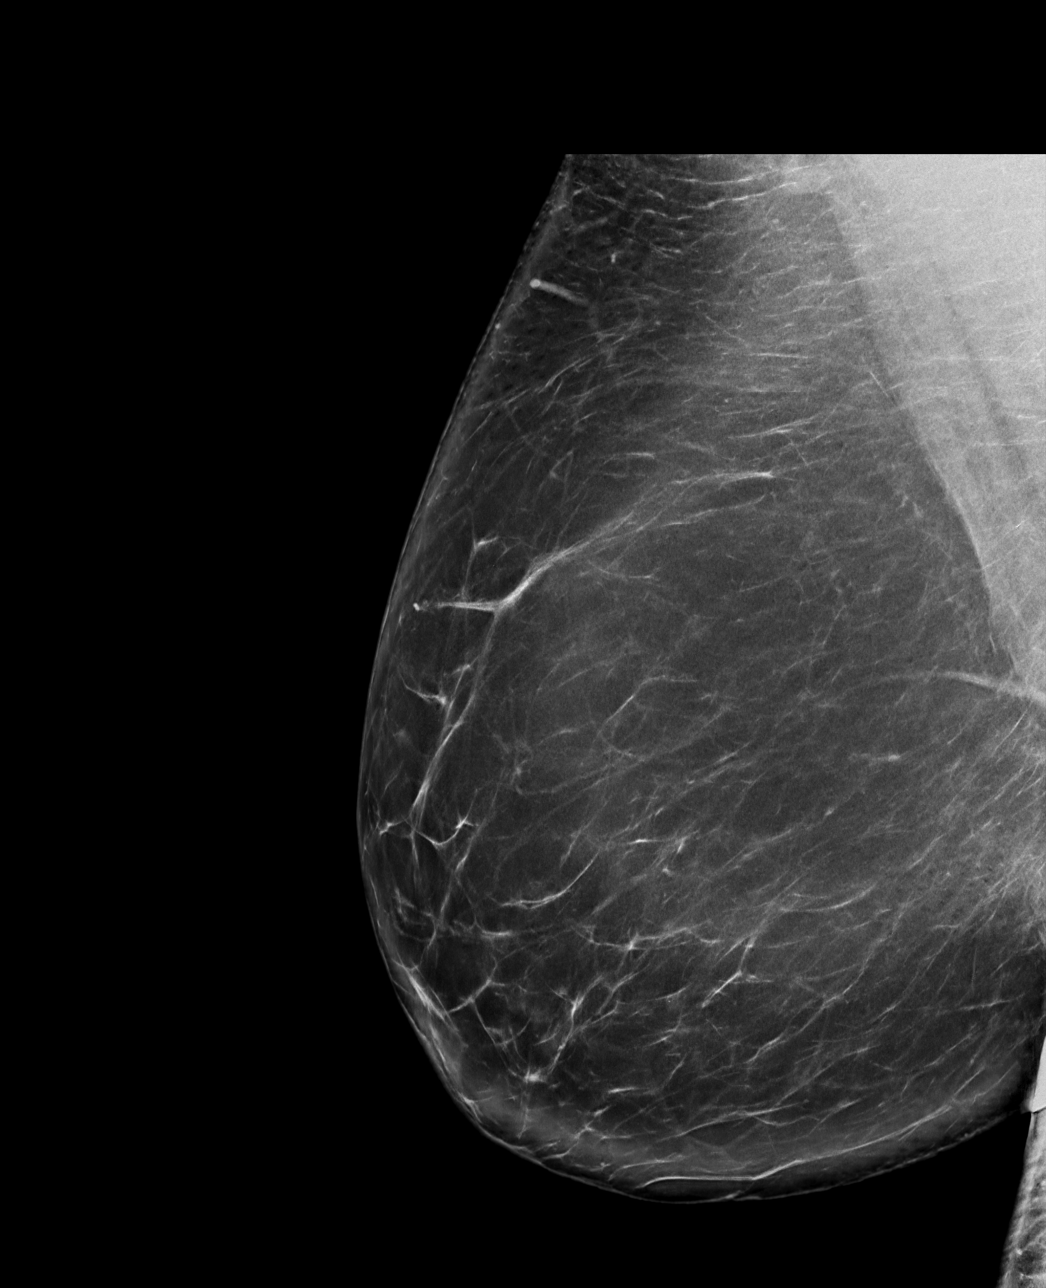

[R MLO tomo · tomo slice 55/108.0]
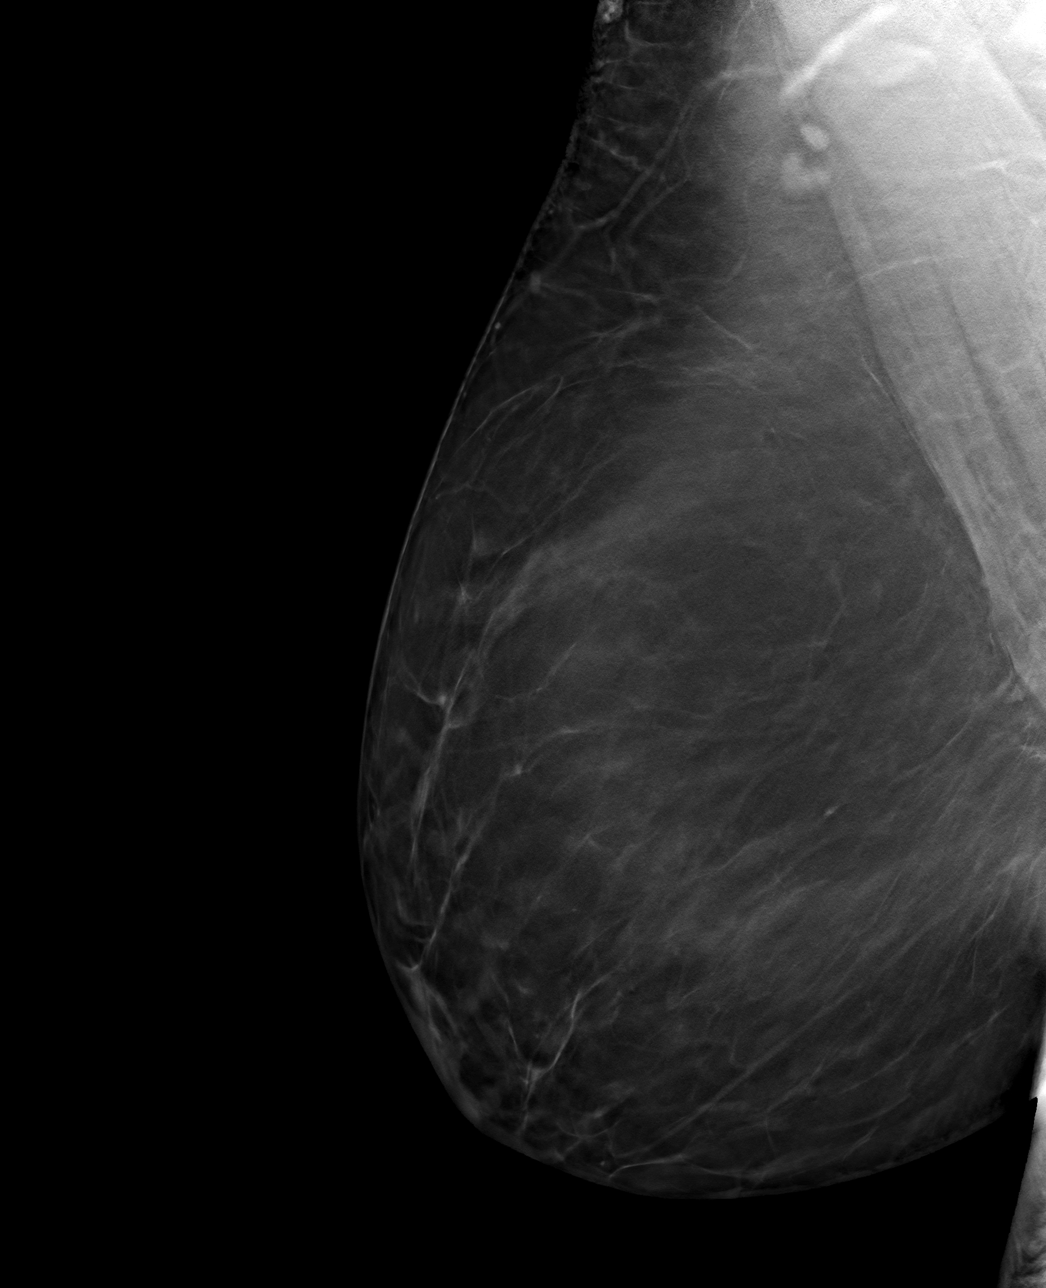

[R CC tomo · tomo slice 46/91.0]
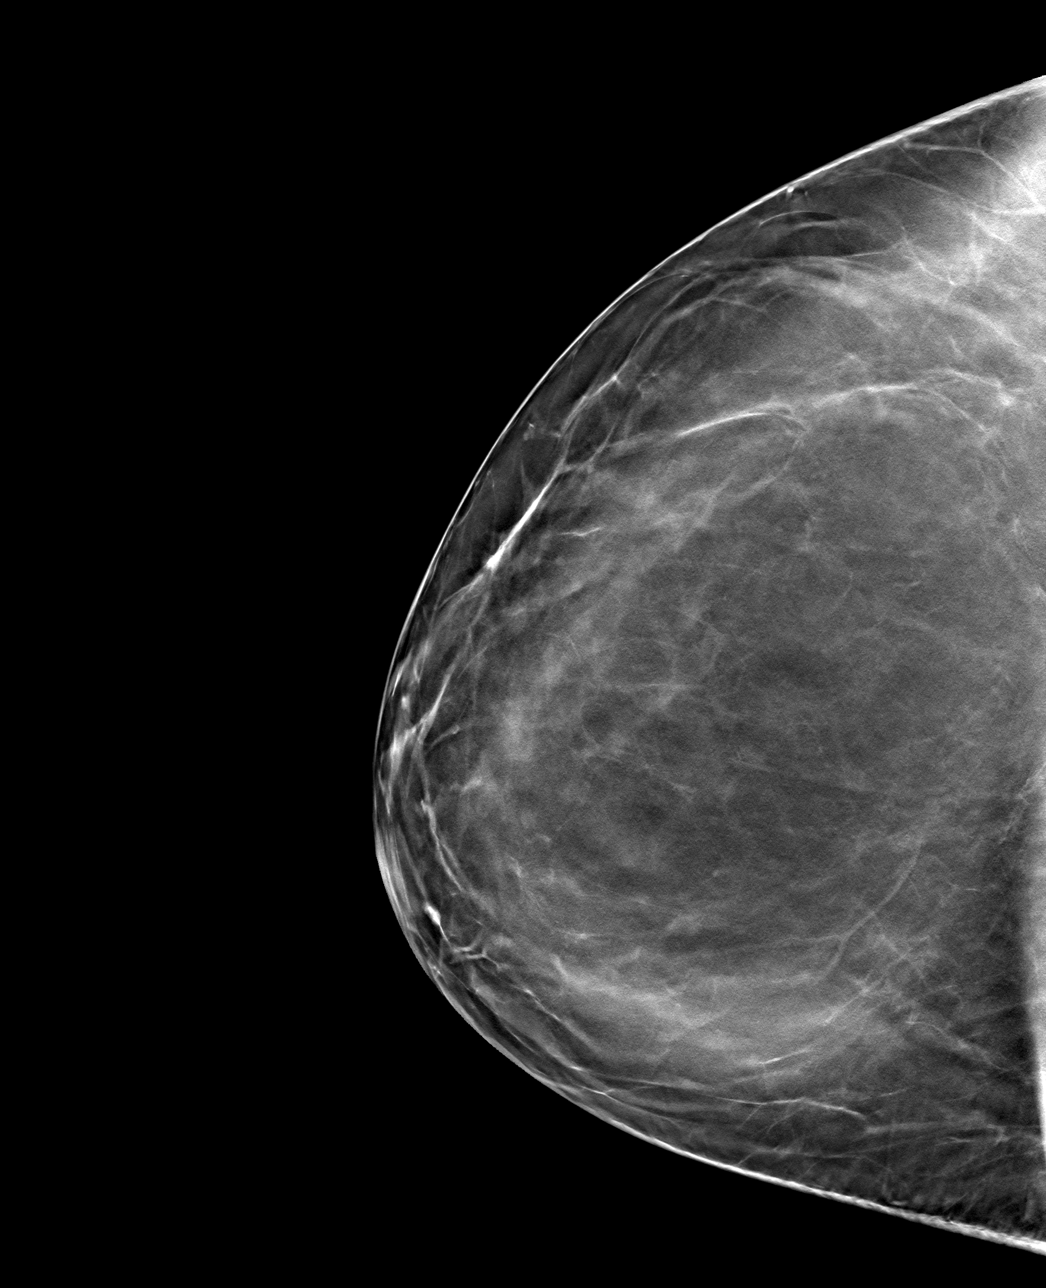

[4 of 12 positions shown; findings below may reference images not displayed]

ACR Breast Density Category b: There are scattered areas of
fibroglandular density.
FINDINGS: Cc and MLO tomograms were performed of the right breast. There is an
oval mass in the lower central right breast measuring approximately
0.4 cm.

Mammographic images were processed with CAD.

Targeted ultrasound the right breast was performed demonstrating a
small cyst at the [DATE] position 2 cm from nipple measuring 0.4 x
x 0.3 cm. This corresponds well with the mass seen in the right
breast at mammography.
IMPRESSION: No findings of malignancy in the right breast.

RECOMMENDATION:
Screening mammogram in one year.(Code:ZV-A-OCV)

I have discussed the findings and recommendations with the patient.
If applicable, a reminder letter will be sent to the patient
regarding the next appointment.

BI-RADS CATEGORY  2: Benign.

## 2021-03-26 ENCOUNTER — Ambulatory Visit
Admission: RE | Admit: 2021-03-26 | Discharge: 2021-03-26 | Disposition: A | Discharge status: Home / Self Care | Payer: No Typology Code available for payment source | Source: Ambulatory Visit | Attending: Family Medicine | Admitting: Family Medicine

## 2021-03-26 ENCOUNTER — Other Ambulatory Visit: Payer: Self-pay

## 2021-03-26 DIAGNOSIS — Z1231 Encounter for screening mammogram for malignant neoplasm of breast: Secondary | ICD-10-CM

## 2021-06-05 ENCOUNTER — Encounter (HOSPITAL_COMMUNITY): Payer: Self-pay

## 2021-06-05 ENCOUNTER — Emergency Department (HOSPITAL_COMMUNITY): Payer: No Typology Code available for payment source

## 2021-06-05 ENCOUNTER — Emergency Department (HOSPITAL_COMMUNITY)
Admission: EM | Admit: 2021-06-05 | Discharge: 2021-06-05 | Disposition: A | Discharge status: Home / Self Care | Payer: No Typology Code available for payment source | Attending: Emergency Medicine | Admitting: Emergency Medicine

## 2021-06-05 DIAGNOSIS — Z87891 Personal history of nicotine dependence: Secondary | ICD-10-CM | POA: Insufficient documentation

## 2021-06-05 DIAGNOSIS — M62838 Other muscle spasm: Secondary | ICD-10-CM | POA: Diagnosis present

## 2021-06-05 DIAGNOSIS — R200 Anesthesia of skin: Secondary | ICD-10-CM | POA: Insufficient documentation

## 2021-06-05 DIAGNOSIS — M542 Cervicalgia: Secondary | ICD-10-CM | POA: Diagnosis not present

## 2021-06-05 DIAGNOSIS — M549 Dorsalgia, unspecified: Secondary | ICD-10-CM

## 2021-06-05 DIAGNOSIS — R202 Paresthesia of skin: Secondary | ICD-10-CM | POA: Insufficient documentation

## 2021-06-05 LAB — BASIC METABOLIC PANEL
Anion gap: 8 (ref 5–15)
BUN: 14 mg/dL (ref 6–20)
CO2: 22 mmol/L (ref 22–32)
Calcium: 9.4 mg/dL (ref 8.9–10.3)
Chloride: 108 mmol/L (ref 98–111)
Creatinine, Ser: 1.03 mg/dL — ABNORMAL HIGH (ref 0.44–1.00)
GFR, Estimated: >60 mL/min (ref >60)
Glucose, Bld: 94 mg/dL (ref 70–99)
Potassium: 3.9 mmol/L (ref 3.5–5.1)
Sodium: 138 mmol/L (ref 135–145)

## 2021-06-05 LAB — CBC
HCT: 42.1 % (ref 36.0–46.0)
Hemoglobin: 13.8 g/dL (ref 12.0–15.0)
MCH: 28.2 pg (ref 26.0–34.0)
MCHC: 32.8 g/dL (ref 30.0–36.0)
MCV: 86.1 fL (ref 80.0–100.0)
Platelets: 358 10*3/uL (ref 150–400)
RBC: 4.89 MIL/uL (ref 3.87–5.11)
RDW: 13.6 % (ref 11.5–15.5)
WBC: 8.4 10*3/uL (ref 4.0–10.5)
nRBC: 0 % (ref 0.0–0.2)

## 2021-06-05 LAB — CK: Total CK: 154 U/L (ref 38–234)

## 2021-06-05 MED ORDER — CYCLOBENZAPRINE HCL 10 MG PO TABS: 10.0000 mg | ORAL_TABLET | Freq: Two times a day (BID) | ORAL | 0 refills | Status: DC | PRN

## 2021-06-05 MED ORDER — GADOBUTROL 1 MMOL/ML IV SOLN
9.0000 mL | Freq: Once | INTRAVENOUS | Status: AC | PRN
  Administered 2021-06-05: 9 mL via INTRAVENOUS

## 2021-06-05 MED ORDER — LIDOCAINE 5 % EX PTCH: 1.0000 | MEDICATED_PATCH | CUTANEOUS | 0 refills | Status: DC

## 2021-06-05 MED ORDER — LORAZEPAM 2 MG/ML IJ SOLN
0.5000 mg | Freq: Once | INTRAMUSCULAR | Status: AC
  Administered 2021-06-05: 0.5 mg via INTRAVENOUS
  Filled 2021-06-05: qty 1

## 2021-06-05 MED ORDER — PREDNISONE 20 MG PO TABS: 40.0000 mg | ORAL_TABLET | Freq: Every day | ORAL | 0 refills | Status: AC

## 2021-06-05 NOTE — ED Notes (Signed)
Pt ambulatory to the bathroom with minimal assistance 

## 2021-06-05 NOTE — Discharge Instructions (Signed)
Your history, exam, work-up today are concerning for back and neck spasms leading to some nerve symptoms in your extremities.  The MRIs tonight did not show any cord compression or acute nerve impingement however I am concerned that the spasms are causing her symptoms.  Please take the muscle medicine, the numbing patches, and the steroids to help with the symptoms and call your back doctor for follow-up.  If any symptoms change or worsen, please return to the nearest emergency department.

## 2021-06-05 NOTE — ED Notes (Signed)
Pt ambulatory to bathroom

## 2021-06-05 NOTE — ED Provider Notes (Signed)
[facility]-EMERGENCY DEPT Provider Note   CSN: [id number] Arrival date & time: 06/05/21  1129     History Chief Complaint  Patient presents with   Fatigue   Generalized Body Aches    Jessica Mclaughlin is a 60 y.o. female.  The history is provided by the patient and medical records. No language interpreter was used.  Neurologic Problem This is a new problem. The current episode started more than 1 week ago. The problem occurs constantly. The problem has been gradually worsening. Pertinent negatives include no chest pain, no abdominal pain, no headaches and no shortness of breath. Nothing aggravates the symptoms. Nothing relieves the symptoms. She has tried nothing for the symptoms. The treatment provided no relief.      Past Medical History:  Diagnosis Date   Bronchitis    hx of   Gallstones    hx of   Pneumonia     There are no problems to display for this patient.   Past Surgical History:  Procedure Laterality Date   CERVICAL FUSION     "and plating"   CESAREAN SECTION     x2   CHOLECYSTECTOMY     LUMBAR LAMINECTOMY/DECOMPRESSION MICRODISCECTOMY  11/16/2011   Procedure: LUMBAR LAMINECTOMY/DECOMPRESSION MICRODISCECTOMY 2 LEVELS;  Surgeon: Tia Alert, MD;  Location: [facility];  Service: Neurosurgery;  Laterality: N/A;  Lumbar Laminectomy Decompression Microdiscectomy Lumbar Four-Five Lumbar Five-Sacral One   TUBAL LIGATION       OB History   No obstetric history on file.     No family history on file.  Social History   Tobacco Use   Smoking status: Former    Types: Cigarettes    Quit date: 06/23/2010    Years since quitting: 10.9   Smokeless tobacco: Never  Substance Use Topics   Alcohol use: No   Drug use: No    Home Medications Prior to Admission medications   Medication Sig Start Date End Date Taking? Authorizing Provider  amLODipine (NORVASC) 2.5 MG tablet Take 1 tablet by mouth daily.   Yes [provider]   Ascorbic Acid (VITAMIN C) 500 MG CHEW Chew 1 tablet by mouth daily.   Yes [provider]  Biotin 1000 MCG tablet Take 1,000 mcg by mouth daily.   Yes [provider]  Cholecalciferol (VITAMIN D3) 50 MCG (2000 UT) capsule Take 2,000 Units by mouth daily.   Yes [provider]  ibuprofen (ADVIL) 200 MG tablet Take 400 mg by mouth every 6 (six) hours as needed for mild pain or moderate pain.   Yes [provider]  naproxen (NAPROSYN) 500 MG tablet Take 1 tablet (500 mg total) by mouth 2 (two) times daily. 06/02/20  Yes Hall-Potvin, [place], PA-C  rosuvastatin (CRESTOR) 10 MG tablet Take 10 mg by mouth daily.   Yes [provider]    Allergies    Patient has no known allergies.  Review of Systems   Review of Systems  Constitutional:  Positive for fatigue. Negative for chills, diaphoresis and fever.  HENT:  Negative for congestion.   Eyes:  Negative for visual disturbance.  Respiratory:  Negative for cough, chest tightness, shortness of breath and wheezing.   Cardiovascular:  Negative for chest pain.  Gastrointestinal:  Negative for abdominal pain, constipation, diarrhea, nausea and vomiting.  Genitourinary:  Negative for dysuria, flank pain and frequency.  Musculoskeletal:  Positive for back pain, myalgias and neck pain.  Skin:  Negative for rash and wound.  Neurological:  Positive for weakness and numbness. Negative for syncope, speech difficulty, light-headedness and headaches.  Psychiatric/Behavioral:  Negative for agitation.   All other systems reviewed and are negative.  Physical Exam Updated Vital Signs BP 135/77   Pulse 73   Temp 99 F (37.2 C) (Oral)   Resp 17   LMP 10/19/2012   SpO2 100%   Physical Exam Vitals and nursing note reviewed.  Constitutional:      General: She is not in acute distress.    Appearance: She is well-developed. She is not ill-appearing, toxic-appearing or diaphoretic.  HENT:     Head: Normocephalic  and atraumatic.     Nose: No congestion or rhinorrhea.     Mouth/Throat:     Mouth: Mucous membranes are moist.     Pharynx: No oropharyngeal exudate.  Eyes:     Extraocular Movements: Extraocular movements intact.     Conjunctiva/sclera: Conjunctivae normal.     Pupils: Pupils are equal, round, and reactive to light.  Cardiovascular:     Rate and Rhythm: Normal rate and regular rhythm.     Heart sounds: No murmur heard. Pulmonary:     Effort: Pulmonary effort is normal. No respiratory distress.     Breath sounds: Normal breath sounds. No wheezing, rhonchi or rales.  Chest:     Chest wall: No tenderness.  Abdominal:     General: Abdomen is flat.     Palpations: Abdomen is soft.     Tenderness: There is no abdominal tenderness. There is no guarding or rebound.  Musculoskeletal:     Cervical back: Neck supple.  Skin:    General: Skin is warm and dry.  Neurological:     Mental Status: She is alert.    ED Results / Procedures / Treatments   Labs (all labs ordered are listed, but only abnormal results are displayed) Labs Reviewed  BASIC METABOLIC PANEL - Abnormal; Notable for the following components:      Result Value   Creatinine, Ser 1.03 (*)    All other components within normal limits  CBC  CK    EKG None  Radiology MR Cervical Spine W or Wo Contrast  Result Date: 06/05/2021 CLINICAL DATA:  Initial evaluation for cervical radiculopathy, bilateral arm numbness and weakness with neck pain. Prior surgery. EXAM: MRI CERVICAL SPINE WITHOUT AND WITH CONTRAST TECHNIQUE: Multiplanar and multiecho pulse sequences of the cervical spine, to include the craniocervical junction and cervicothoracic junction, were obtained without and with intravenous contrast. CONTRAST:  45mL GADAVIST GADOBUTROL 1 MMOL/ML IV SOLN COMPARISON:  Prior study from 05/28/2010. FINDINGS: Alignment: Straightening of the normal cervical lordosis. No listhesis. Vertebrae: Prior ACDF at C6-7. Vertebral body  height maintained without acute or chronic fracture. Bone marrow signal intensity within normal limits. No discrete or worrisome osseous lesions. No abnormal marrow edema or enhancement. Cord: Normal signal and morphology. No abnormal enhancement. Posterior Fossa, vertebral arteries, paraspinal tissues: Unremarkable. Disc levels: C2-C3: Minimal endplate spurring without significant disc bulge. No canal or foraminal stenosis. C3-C4: Mild disc bulge with endplate spurring. Shallow central disc osteophyte mildly indents the ventral thecal sac. No significant spinal stenosis or cord impingement. Foramina remain patent. C4-C5: Mild disc bulge with uncovertebral spurring. No significant spinal stenosis. Foramina remain patent. C5-C6: Disc bulge with bilateral uncovertebral hypertrophy. Mild flattening of the ventral thecal sac without significant spinal stenosis. Mild right C6 foraminal narrowing. Left neural foramina remains patent. C6-C7:  Prior fusion.  No residual canal or foraminal stenosis. C7-T1: Negative  interspace. Mild right-sided facet hypertrophy. No stenosis. IMPRESSION: 1. No acute abnormality within the cervical spine. 2. Prior ACDF at C6-7 without residual canal or foraminal stenosis. 3. Mild degenerative spondylosis elsewhere within the cervical spine as above. No significant spinal stenosis or cord impingement. No other findings to explain patient's symptoms. Electronically Signed   By: Jeannine Boga M.D.   On: 06/05/2021 20:54   MR THORACIC SPINE W WO CONTRAST  Result Date: 06/05/2021 CLINICAL DATA:  Initial evaluation for bilateral arm numbness and weakness with neck pain, upper back pain, bilateral leg sensation and weakness. EXAM: MRI THORACIC WITHOUT AND WITH CONTRAST TECHNIQUE: Multiplanar and multiecho pulse sequences of the thoracic spine were obtained without and with intravenous contrast. CONTRAST:  104mL GADAVIST GADOBUTROL 1 MMOL/ML IV SOLN COMPARISON:  None available. FINDINGS:  Alignment: Physiologic with preservation of the normal thoracic kyphosis. No listhesis. Vertebrae: Vertebral body height maintained without acute or chronic fracture. Bone marrow signal intensity within normal limits. Few small benign hemangiomata noted. No worrisome osseous lesions. No abnormal marrow edema or enhancement. Cord:  Normal signal and morphology.  No abnormal enhancement. Paraspinal and other soft tissues: Paraspinous soft tissues within normal limits. 2 cm simple cyst noted within the visualized right kidney. Additional subcentimeter simple cyst noted within the posterior right hepatic lobe. Disc levels: T6-7: Small left paracentral disc protrusion minimally indents the left ventral thecal sac. No significant spinal stenosis or cord deformity. Foramina remain patent. T7-8: Small left paracentral disc protrusion minimally indents the left ventral thecal sac. No significant spinal stenosis or cord deformity. Foramina remain patent. T8-9: Small right paracentral disc protrusion mildly indents the right ventral thecal sac. No significant spinal stenosis or cord deformity. Right-sided facet hypertrophy noted. No significant stenosis. T9-10: Small right paracentral to foraminal disc protrusion indents the right ventral thecal sac. No significant spinal stenosis or cord deformity. Foramina remain patent. Otherwise, no other significant disc pathology for age. No significant canal or foraminal stenosis. No neural impingement. IMPRESSION: 1. No acute abnormality within the thoracic spine. 2. Small disc protrusions at T6-7 through T9-10 as above without significant stenosis or neural impingement. No other findings to explain patient's symptoms identified. Electronically Signed   By: Jeannine Boga M.D.   On: 06/05/2021 21:05    Procedures Procedures   Medications Ordered in ED Medications  LORazepam (ATIVAN) injection 0.5 mg (0.5 mg Intravenous Given 06/05/21 1852)  gadobutrol (GADAVIST) 1 MMOL/ML  injection 9 mL (9 mLs Intravenous Contrast Given 06/05/21 2027)    ED Course  I have reviewed the triage vital signs and the nursing notes.  Pertinent labs & imaging results that were available during my care of the patient were reviewed by me and considered in my medical decision making (see chart for details).    MDM Rules/Calculators/A&P                           Jessica Mclaughlin is a 60 y.o. female with a past medical history significant for previous cervical spine surgery, lumbar surgery, and cholecystectomy who presents with neck pain and extremity symptoms for the last 2 weeks.  According to patient, she was in [place] visiting a new grandchild and had to sleep on a couch.  She reports after sitting on the couch, she started having pain in her neck worse on the right than left followed by progressive symptoms of numbness and weakness in both of her arms going down to her fingers  as well as worsening weakness and some numbness in her legs.  She denies any loss of bowel or bladder control but reports he is having difficulty walking now.  She denies any headache and denies any unilateral symptoms.  She denies any fevers, chills, cough, nausea, vomiting, constipation, diarrhea, or urinary changes.  Denies any chest pain or abdominal pain.  Denies any trauma.  She does report that she went to a masseuse after the symptoms began and that did not seem to help with the neck massage.  She denies a history of chiropractor use and denies any history of vascular troubles in the neck such as dissection.  No reported history of MS or previous stroke.  On my exam, patient had some symmetric weakness in both hands.  She was negative for Hoffmann sign for myelopathy bilaterally.  Otherwise she had vague numbness reported in the bilateral hands.  Symmetric smile and clear speech.  Pupils symmetric and reactive normal extract movements.  No carotid bruit appreciated.  She had tenderness in the paraspinal neck.   No nuchal rigidity or worsening of pain when she bent forwards but it was worse when she twisted sideways.  She had symmetric strength in the legs which she felt was slightly weaker than baseline and some reported mild symmetric numbness although she was able to feel touch on both sides.  Clinically I suspect she aggravated her neck which she has had surgery on the past with sleeping on it awkwardly and this is led to some radicular type symptoms in her arms and legs.  She does not have history of MS however, I had discussion with her about imaging and after speaking with neurology as well, we felt it was reasonable to get MRI of the cervical and T-spine and we will do it with and without contrast to get the best look at what could be causing her symptoms.  She was seen in triage and had some basic labs including a CK for possible myositis or rhabdo which was negative.  Anticipate disposition based on imaging of the neck and upper back.  Imaging did not show any acute abnormalities.  Suspect muscle spasms from the abnormal sleep and some radicular symptoms.  We agreed to do a burst of steroids and a muscle relaxant and Lidoderm patches.  Patient agrees and will call her back team.  She no other questions or concerns and was discharged in good condition after reassuring work-up   Final Clinical Impression(s) / ED Diagnoses Final diagnoses:  Muscle spasm  Acute bilateral back pain, unspecified back location  Neck pain  Numbness and tingling in both hands    Rx / DC Orders ED Discharge Orders          Ordered    predniSONE (DELTASONE) 20 MG tablet  Daily        06/05/21 2212    cyclobenzaprine (FLEXERIL) 10 MG tablet  2 times daily PRN        06/05/21 2212    lidocaine (LIDODERM) 5 %  Every 24 hours        06/05/21 2212           Clinical Impression: 1. Muscle spasm   2. Acute bilateral back pain, unspecified back location   3. Neck pain   4. Numbness and tingling in both hands      Disposition: Discharge  Condition: Good  I have discussed the results, Dx and Tx plan with the pt(& family if present). He/she/they expressed understanding  and agree(s) with the plan. Discharge instructions discussed at great length. Strict return precautions discussed and pt &/or family have verbalized understanding of the instructions. No further questions at time of discharge.    Discharge Medication List as of 06/05/2021 10:14 PM     START taking these medications   Details  cyclobenzaprine (FLEXERIL) 10 MG tablet Take 1 tablet (10 mg total) by mouth 2 (two) times daily as needed for muscle spasms., Starting Sat 06/05/2021, Print    lidocaine (LIDODERM) 5 % Place 1 patch onto the skin daily. Remove & Discard patch within 12 hours or as directed by MD, Starting Sat 06/05/2021, Print    predniSONE (DELTASONE) 20 MG tablet Take 2 tablets (40 mg total) by mouth daily for 5 days., Starting Sat 06/05/2021, Until Thu 06/10/2021, Print        Follow Up: Carol [address], [place] [id number] [phone]     your back doctor        Rafael Quesada, Gwenyth Allegra, MD 06/06/21 [phone]

## 2021-06-05 NOTE — ED Notes (Signed)
Patient transported to MRI 

## 2021-06-05 NOTE — ED Triage Notes (Signed)
Pt c/o generalized body aches x 3 weeks and weakness in all extremities x 1 week. Pt was ambulatory to triage room.  Reports a fall two days ago, denies injury.

## 2021-06-05 NOTE — ED Provider Notes (Signed)
Emergency Medicine Provider Triage Evaluation Note  Jessica Mclaughlin , a 60 y.o. female  was evaluated in triage.  Pt complains of generalized body weakness and decreased grip strength for the past 3-4 weeks after sleeping on her daughter's couch in [place]. Patient reports she had a fall yesterday after tripping over a pillow. She reports she is having generalized left sided body pain, not focal. Denies nay speech problems or headaches. Denies any rashes or tick bites.   Review of Systems  Positive: Generalized body weakness and myalgias.   Negative: Chest pain, SOB, headache, slurred speech  Physical Exam  BP (!) 140/93 (BP Location: Left Arm)   Pulse 81   Temp 99 F (37.2 C) (Oral)   Resp 18   LMP 10/19/2012   SpO2 98%  Gen:   Awake, no distress   Resp:  Normal effort  MSK:   Moves extremities without difficulty  Other:  Patient has weak hand grip strength, but strong with resistance. Pulses intact. Sensation intact.   Medical Decision Making  Medically screening exam initiated at 11:57 AM.  Appropriate orders placed.  Jessica Mclaughlin was informed that the remainder of the evaluation will be completed by another provider, this initial triage assessment does not replace that evaluation, and the importance of remaining in the ED until their evaluation is complete.  Labs ordered.    Achille Rich, PA-C 06/05/21 1203    Gwyneth Sprout, MD 06/07/21 [phone]

## 2021-07-13 ENCOUNTER — Ambulatory Visit (INDEPENDENT_AMBULATORY_CARE_PROVIDER_SITE_OTHER): Payer: No Typology Code available for payment source | Admitting: Neurology

## 2021-07-13 ENCOUNTER — Encounter: Payer: Self-pay | Admitting: Neurology

## 2021-07-13 VITALS — BP 148/88 | HR 85 | Ht 64.0 in | Wt 225.8 lb

## 2021-07-13 DIAGNOSIS — S161XXA Strain of muscle, fascia and tendon at neck level, initial encounter: Secondary | ICD-10-CM

## 2021-07-13 DIAGNOSIS — M791 Myalgia, unspecified site: Secondary | ICD-10-CM

## 2021-07-13 DIAGNOSIS — R202 Paresthesia of skin: Secondary | ICD-10-CM | POA: Diagnosis not present

## 2021-07-13 DIAGNOSIS — R200 Anesthesia of skin: Secondary | ICD-10-CM

## 2021-07-13 NOTE — Patient Instructions (Addendum)
Emg/ncs Blood test  Consider trying dry needling - PT or [email or web address] or call around and see if ou can try dry needling for cervical myofascial strain CTS: Wear CTS splints at bedtime  Carpal Tunnel Syndrome Carpal tunnel syndrome is a condition that causes pain, numbness, and weakness in your hand and fingers. The carpal tunnel is a narrow area located on the palm side of your wrist. Repeated wrist motion or certain diseases may cause swelling within the tunnel. This swelling pinches the main nerve in the wrist. The main nerve in the wrist is called the median nerve. What are the causes? This condition may be caused by: Repeated and forceful wrist and hand motions. Wrist injuries. Arthritis. A cyst or tumor in the carpal tunnel. Fluid buildup during pregnancy. Use of tools that vibrate. Sometimes the cause of this condition is not known. What increases the risk? The following factors may make you more likely to develop this condition: Having a job that requires you to repeatedly or forcefully move your wrist or hand or requires you to use tools that vibrate. This may include jobs that involve using computers, working on an [employer], or working with power tools such as Radiographer, therapeutic. Being a woman. Having certain conditions, such as: Diabetes. Obesity. An underactive thyroid (hypothyroidism). Kidney failure. Rheumatoid arthritis. What are the signs or symptoms? Symptoms of this condition include: A tingling feeling in your fingers, especially in your thumb, index, and middle fingers. Tingling or numbness in your hand. An aching feeling in your entire arm, especially when your wrist and elbow are bent for a long time. Wrist pain that goes up your arm to your shoulder. Pain that goes down into your palm or fingers. A weak feeling in your hands. You may have trouble grabbing and holding items. Your symptoms may feel worse during the night. How is this diagnosed? This condition  is diagnosed with a medical history and physical exam. You may also have tests, including: Electromyogram (EMG). This test measures electrical signals sent by your nerves into the muscles. Nerve conduction study. This test measures how well electrical signals pass through your nerves. Imaging tests, such as X-rays, ultrasound, and MRI. These tests check for possible causes of your condition. How is this treated? This condition may be treated with: Lifestyle changes. It is important to stop or change the activity that caused your condition. Doing exercise and activities to strengthen and stretch your muscles and tendons (physical therapy). Making lifestyle changes to help with your condition and learning how to do your daily activities safely (occupational therapy). Medicines for pain and inflammation. This may include medicine that is injected into your wrist. A wrist splint or brace. Surgery. Follow these instructions at home: If you have a splint or brace: Wear the splint or brace as told by your health care provider. Remove it only as told by your health care provider. Loosen the splint or brace if your fingers tingle, become numb, or turn cold and blue. Keep the splint or brace clean. If the splint or brace is not waterproof: Do not let it get wet. Cover it with a watertight covering when you take a bath or shower. Managing pain, stiffness, and swelling If directed, put ice on the painful area. To do this: If you have a removeable splint or brace, remove it as told by your health care provider. Put ice in a plastic bag. Place a towel between your skin and the bag or between the splint or  brace and the bag. Leave the ice on for 20 minutes, 2-3 times a day. Do not fall asleep with the cold pack on your skin. Remove the ice if your skin turns bright red. This is very important. If you cannot feel pain, heat, or cold, you have a greater risk of damage to the area. Move your fingers often  to reduce stiffness and swelling. General instructions Take over-the-counter and prescription medicines only as told by your health care provider. Rest your wrist and hand from any activity that may be causing your pain. If your condition is work related, talk with your employer about changes that can be made, such as getting a wrist pad to use while typing. Do any exercises as told by your health care provider, physical therapist, or occupational therapist. Keep all follow-up visits. This is important. Contact a health care provider if: You have new symptoms. Your pain is not controlled with medicines. Your symptoms get worse. Get help right away if: You have severe numbness or tingling in your wrist or hand. Summary Carpal tunnel syndrome is a condition that causes pain, numbness, and weakness in your hand and fingers. It is usually caused by repeated wrist motions. Lifestyle changes and medicines are used to treat carpal tunnel syndrome. Surgery may be recommended. Follow your health care provider's instructions about wearing a splint, resting from activity, keeping follow-up visits, and calling for help. This information is not intended to replace advice given to you by your health care provider. Make sure you discuss any questions you have with your health care provider. Document Revised: 11/21/2019 Document Reviewed: 11/21/2019 Elsevier Patient Education  2022 Elsevier Inc.   Electromyoneurogram Electromyoneurogram is a test to check how well your muscles and nerves are working. This procedure includes the combined use of electromyogram (EMG) and nerve conduction study (NCS). EMG is used to evaluate muscles and the nerves that control those muscles. NCS, which is also called electroneurogram, measures how well your nerves conduct electricity. The procedures should be done together to check if your muscles and nerves are healthy. If the results of the tests are abnormal, this may  indicate disease or injury, such as a neuromuscular disease or peripheral nerve damage. Tell a health care provider about: Any allergies you have. All medicines you are taking, including vitamins, herbs, eye drops, creams, and over-the-counter medicines. Any bleeding problems you have. Any surgeries you have had. Any medical conditions you have. What are the risks? Generally, this is a safe procedure. However, problems may occur, including: Bleeding or bruising. Infection where the electrodes were inserted. What happens before the test? Medicines Take all of your usually prescribed medications before this testing is performed. Do not stop your blood thinners unless advised by your prescribing physician. General instructions Your health care provider may ask you to warm the limb that will be checked with warm water, hot pack, or wrapping the limb in a blanket. Do not use lotions or creams on the same day that you will be having the procedure. What happens during the test? For EMG  Your health care provider will ask you to stay in a position so that the muscle being studied can be accessed. You will be sitting or lying down. You may be given a medicine to numb the area (local anesthetic) and the skin will be disinfected. A very thin needle that has an electrode will be inserted into your muscle, one muscle at a time. Typically, multiple muscles are evaluated during a single  study. Another small electrode will be placed on your skin near the muscle. Your health care provider will ask you to continue to remain still. The electrodes will record the electrical activity of your muscles. You may see this on a monitor or hear it in the room. After your muscles have been studied at rest, your health care provider will ask you to contract or flex your muscles. The electrodes will record the electrical activity of your muscles. Your health care provider will remove the electrodes and the electrode  needle when the procedure is finished. The procedure may vary among health care providers and hospitals. For NCS  An electrode that records your nerve activity (recording electrode) will be placed on your skin by the muscle that is being studied. An electrode that is used as a reference (reference electrode) will be placed near the recording electrode. A paste or gel will be applied to your skin between the recording electrode and the reference electrode. Your nerve will be stimulated with a mild shock. The speed of the nerves and strength of response is recorded by the electrodes. Your health care provider will remove the electrodes and the gel when the procedure is finished. The procedure may vary among health care providers and hospitals. What can I expect after the test? It is up to you to get your test results. Ask your health care provider, or the department that is doing the test, when your results will be ready. Your health care provider may: Give you medicines for any pain. Monitor the insertion sites to make sure that bleeding stops. You should be able to drive yourself to and from the test. Discomfort can persist for a few hours after the test, but should be better the next day. Contact a health care provider if: You have swelling, redness, or drainage at any of the insertion sites. Summary Electromyoneurogram is a test to check how well your muscles and nerves are working. If the results of the tests are abnormal, this may indicate disease or injury. This is a safe procedure. However, problems may occur, such as bleeding and infection. Your health care provider will do two tests to complete this procedure. One checks your muscles (EMG) and another checks your nerves (NCS). It is up to you to get your test results. Ask your health care provider, or the department that is doing the test, when your results will be ready. This information is not intended to replace advice given to  you by your health care provider. Make sure you discuss any questions you have with your health care provider. Document Revised: 03/24/2021 Document Reviewed: 02/21/2021 Elsevier Patient Education  2022 [employer].

## 2021-07-13 NOTE — Progress Notes (Signed)
[facility]    Provider:  Dr Jaynee Eagles Requesting Provider: Shirline Frees, MD Primary Care Provider:  Carol Ada, MD  CC:  numbness and tingling in the hands, muscular shoulder pain after sleeping on a couch for 4 weeks  HPI:  Jessica Mclaughlin is a 60 y.o. female here as requested by Shirline Frees, MD for numbness and tingling in the both hands and feet.   She slept on her daughter's couch for several weeks bc of a new baby, she was there eon the couch for 3-4 weeks, she came home and felt weakness, she felt she couldn't move her legs and body, her left leg was dragging she felt, she felt it all over arms and the legs, weakness in legs, hands and arms. Her leg symptoms are resolved but it is in the arms. When shetries to lift her arms she has pain radiating down the shoulder on both arms and when she raise arms feels pain in the joint of the shoulder. She has numbness and tingling of hands, loss of strength in hands, hurts around the top of the arms and in the neck, worse when laying on left side. Worse in te morning, nocturnal awakening for numbness and tingling. She has not been to physical therapy, she goes to massage monthly, prednisone and flexeril helped. Her pcp gave her more prednisone but dr Tamala Julian thought not to take it and gave her meloxicam. No facial weakness, vision changes, difficulty speaking or walking. No other focal neurologic deficits, associated symptoms, inciting events or modifiable factors. She declines PT. Left is worse. No other focal neurologic deficits, associated symptoms, inciting events or modifiable factors.  Reviewed notes, labs and imaging from outside physicians, which showed:06/05/2021: MRI cervical spine  IMPRESSION: reviewed images and agree.  1. No acute abnormality within the cervical spine. 2. Prior ACDF at C6-7 without residual canal or foraminal stenosis. 3. Mild degenerative spondylosis elsewhere within the cervical spine as above.  No significant spinal stenosis or cord impingement. No other findings to explain patient's symptoms.  CK normal 06/05/2021 154 Bmp BUN 14, creat 1.03 Cbc normal  Review of Systems: Patient complains of symptoms per HPI as well as the following symptoms new job and in training stress. Pertinent negatives and positives per HPI. All others negative.   Social History   Socioeconomic History   Marital status: Married    Spouse name: Not on file   Number of children: Not on file   Years of education: Not on file   Highest education level: Not on file  Occupational History   Not on file  Tobacco Use   Smoking status: Former    Types: Cigarettes    Quit date: 06/23/2010    Years since quitting: 11.0   Smokeless tobacco: Never  Substance and Sexual Activity   Alcohol use: No   Drug use: No   Sexual activity: Yes  Other Topics Concern   Not on file  Social History Narrative   Not on file   Social Determinants of Health   Financial Resource Strain: Not on file  Food Insecurity: Not on file  Transportation Needs: Not on file  Physical Activity: Not on file  Stress: Not on file  Social Connections: Not on file  Intimate Partner Violence: Not on file    Family History  Problem Relation Age of Onset   Neuropathy Neg Hx     Past Medical History:  Diagnosis Date   Bronchitis    hx of  Gallstones    hx of   Pneumonia     Patient Active Problem List   Diagnosis Date Noted   Numbness and tingling of hand 07/13/2021   Cervical myofascial strain 07/13/2021    Past Surgical History:  Procedure Laterality Date   CERVICAL FUSION     "and plating"   CESAREAN SECTION     x2   CHOLECYSTECTOMY     LUMBAR LAMINECTOMY/DECOMPRESSION MICRODISCECTOMY  11/16/2011   Procedure: LUMBAR LAMINECTOMY/DECOMPRESSION MICRODISCECTOMY 2 LEVELS;  Surgeon: Eustace Moore, MD;  Location: [facility];  Service: Neurosurgery;  Laterality: N/A;  Lumbar Laminectomy Decompression Microdiscectomy  Lumbar Four-Five Lumbar Five-Sacral One   TUBAL LIGATION      Current Outpatient Medications  Medication Sig Dispense Refill   amLODipine (NORVASC) 2.5 MG tablet Take 1 tablet by mouth daily.     Ascorbic Acid (VITAMIN C) 500 MG CHEW Chew 1 tablet by mouth daily.     Biotin 1000 MCG tablet Take 1,000 mcg by mouth daily.     Cholecalciferol (VITAMIN D3) 50 MCG (2000 UT) capsule Take 2,000 Units by mouth daily.     ibuprofen (ADVIL) 200 MG tablet Take 400 mg by mouth every 6 (six) hours as needed for mild pain or moderate pain.     rosuvastatin (CRESTOR) 10 MG tablet Take 10 mg by mouth daily.     No current facility-administered medications for this visit.    Allergies as of 07/13/2021   (No Known Allergies)    Vitals: BP (!) 148/88    Pulse 85    Ht 5' 4"  (1.626 m)    Wt 225 lb 12.8 oz (102.4 kg)    LMP 10/19/2012    BMI 38.76 kg/m  Last Weight:  Wt Readings from Last 1 Encounters:  07/13/21 225 lb 12.8 oz (102.4 kg)   Last Height:   Ht Readings from Last 1 Encounters:  07/13/21 5' 4"  (1.626 m)     Physical exam: Exam: Gen: NAD, conversant, well nourised, obese, well groomed                     CV: RRR, no MRG. No Carotid Bruits. No peripheral edema, warm, nontender Eyes: Conjunctivae clear without exudates or hemorrhage  Neuro: Detailed Neurologic Exam  Speech:    Speech is normal; fluent and spontaneous with normal comprehension.  Cognition:    The patient is oriented to person, place, and time;     recent and remote memory intact;     language fluent;     normal attention, concentration,     fund of knowledge Cranial Nerves:    The pupils are equal, round, and reactive to light. The fundi are flat. Visual fields are full to finger confrontation. Extraocular movements are intact. Trigeminal sensation is intact and the muscles of mastication are normal. The face is symmetric. The palate elevates in the midline. Hearing intact. Voice is normal. Shoulder shrug is  normal. The tongue has normal motion without fasciculations.   Coordination:    Normal    Gait:    normal.   Motor Observation:    No asymmetry, no atrophy, and no involuntary movements noted. Tone:    Normal muscle tone.    Posture:    Posture is normal. normal erect    Strength: weak gri and weak opponens pollicis (median muscles). Otherwise  strength is V/V in the upper and lower limbs.      Sensation: intact to LT  Reflex Exam:  DTR's:    Hypo Ajs otherwise Deep tendon reflexes in the upper and lower extremities are normal bilaterally.   Toes:    The toes are equiv bilaterally.   Clonus:    Clonus is absent.   +Mcphalen's maneuver at wrists positive.   Assessment/Plan:  Patient with upper extremity muscle pain and numbness and tingling in the fingers after sleeping on a couch for 3-4 weeks.   Cervical myofascial strain: Consider trying dry needling - PT or [email or web address] or call around and see if you can try dry needling for cervical myofascial strain. She decline PT. Check ESR for polymyalgia rheumatica but unlikely. MRI cervical spine did not show an etiology.   Hand numbness: likely CTS emg/ncs; +Mcphalen's maneuver at wrists positive.   Orders Placed This Encounter  Procedures   Sedimentation rate   NCV with EMG(electromyography)   No orders of the defined types were placed in this encounter.   Cc: Shirline Frees, MD,  Carol Ada, MD  Sarina Ill, MD  [facility] [address] [place], [place] [address]  Phone [phone] Fax [phone]

## 2021-07-14 ENCOUNTER — Telehealth: Payer: Self-pay | Admitting: *Deleted

## 2021-07-14 LAB — SEDIMENTATION RATE: Sed Rate: 15 mm/hr (ref 0–40)

## 2021-07-14 NOTE — Telephone Encounter (Signed)
-----   Message from Anson Fret, MD sent at 07/14/2021  9:08 AM EST ----- Sed rate normal. She does not have Polymyalgia Rheumatica, the disorder I was testing for thanks

## 2021-07-14 NOTE — Telephone Encounter (Signed)
Called Jessica Mclaughlin & advised her sed rate is normal. Per Dr Lucia Gaskins, Jessica Mclaughlin does not have polymyalgia rheumatica which is the disorder that Dr Lucia Gaskins was testing for. Jessica Mclaughlin verbalized appreciation. Jessica Mclaughlin has EMG/NCV scheduled for 09/09/21.

## 2021-07-20 ENCOUNTER — Telehealth: Payer: Self-pay | Admitting: Neurology

## 2021-07-20 NOTE — Telephone Encounter (Signed)
Pt called wanting to know if she can be started on Prednisone like mentioned in her last appointment. Please advise.

## 2021-07-20 NOTE — Telephone Encounter (Signed)
Asked Dr.Ahern about Prednisone. Per Dr. Lucia Gaskins pt PCP took her off prednisone and starting her on melaxican please call PCP for further instruction for Prednisone . Pt states she understands and thanked me for calling

## 2021-08-13 ENCOUNTER — Ambulatory Visit: Payer: No Typology Code available for payment source | Admitting: Diagnostic Neuroimaging

## 2021-09-09 ENCOUNTER — Ambulatory Visit (INDEPENDENT_AMBULATORY_CARE_PROVIDER_SITE_OTHER): Payer: No Typology Code available for payment source | Admitting: Neurology

## 2021-09-09 ENCOUNTER — Other Ambulatory Visit: Payer: Self-pay

## 2021-09-09 DIAGNOSIS — R200 Anesthesia of skin: Secondary | ICD-10-CM | POA: Diagnosis not present

## 2021-09-09 DIAGNOSIS — R202 Paresthesia of skin: Secondary | ICD-10-CM

## 2021-09-09 DIAGNOSIS — Z0289 Encounter for other administrative examinations: Secondary | ICD-10-CM

## 2021-09-09 DIAGNOSIS — M791 Myalgia, unspecified site: Secondary | ICD-10-CM

## 2021-09-09 DIAGNOSIS — G5603 Carpal tunnel syndrome, bilateral upper limbs: Secondary | ICD-10-CM

## 2021-09-09 NOTE — Progress Notes (Signed)
Full Name: Jessica Mclaughlin Gender: Female MRN #: [id number] Date of Birth: October 21, 1960    Visit Date: 09/09/2021 07:37 Age: 61 Years Examining Physician: Sarina Ill, MD  Referring Physician: Sarina Ill, MD Height: 5 feet 4 inch, 224lbs Temp: 37.0C  History: This is a patient who is here for EMG nerve conduction study today.  I initially saw her in December 2022 for numbness and tingling in the hands and muscular shoulder pain after sleeping on a couch for 4 weeks.  Diagnosed with cervical myofascial strain and possibly carpal tunnel syndrome with positive McPhalen's maneuver at the wrists.  ESR for polymyalgia rheumatica was negative.  MRI of the cervical spine did not show any etiology.  Summary:   Nerve Conduction Studies were performed on the bilateral upper extremities.The right median APB motor nerve showed prolonged distal onset latency (4.8 ms, N<4.4). The right Median 2nd Digit orthodromic sensory nerve showed no response. The left  Median 2nd Digit orthodromic sensory nerve showed no response.All remaining nerves (as indicated in the following tables) were within normal limits. EMG needle study performed on the right upper extremity. All muscles (as indicated in the following tables) were within normal limits.       Conclusion: There is electrophysiologic evidence of moderately-severe right Carpal Tunnel Syndrome and mild left Carpal Tunnel Syndrome.  No suggestion of polyneuropathy or radiculopathy.   Orders Placed This Encounter  Procedures   Ambulatory referral to Hand Surgery       ------------------------------- Sarina Ill M.D.  [facility] [address], [place], [place] [address] Tel: [phone] Fax: [phone]  Verbal informed consent was obtained from the patient, patient was informed of potential risk of procedure, including bruising, bleeding, hematoma formation, infection, muscle weakness, muscle pain, numbness, among  others.        [place]    Nerve / Sites Muscle Latency Ref. Amplitude Ref. Rel Amp Segments Distance Velocity Ref. Area    ms ms mV mV %  cm m/s m/s mVms  R Median - APB     Wrist APB 4.8 ?4.4 8.5 ?4.0 100 Wrist - APB 7   28.1     Upper arm APB 9.1  3.7  43.9 Upper arm - Wrist 21 49 ?49 12.5     Ulnar Below Elbow APB 6.2  2.5  67.5 Ulnar Below Elbow - APB    4.8     Ulnar Above Elbow APB 7.8  2.9  113 Ulnar Above Elbow - APB    5.2  L Median - APB     Wrist APB 4.4 ?4.4 10.2 ?4.0 100 Wrist - APB 7   29.8     Upper arm APB 8.5  7.9  77 Upper arm - Wrist 20 49 ?49 22.8  R Ulnar - ADM     Wrist ADM 2.1 ?3.3 9.3 ?6.0 100 Wrist - ADM 7   22.6     B.Elbow ADM 5.1  8.8  94.4 B.Elbow - Wrist 19 62 ?49 21.8     A.Elbow ADM 6.8  8.6  98.2 A.Elbow - B.Elbow 10 60 ?49 21.5           SNC    Nerve / Sites Rec. Site Peak Lat Ref.  Amp Ref. Segments Distance    ms ms V V  cm  R Median - Orthodromic (Dig II, Mid palm)     Dig II Wrist NR ?3.4 NR ?10 Dig II - Wrist 13  L Median - Orthodromic (Dig II, Mid palm)     Dig II Wrist NR ?3.4 NR ?10 Dig II - Wrist 13  R Ulnar - Orthodromic, (Dig V, Mid palm)     Dig V Wrist 2.8 ?3.1 6 ?5 Dig V - Wrist 11  L Ulnar - Orthodromic, (Dig V, Mid palm)     Dig V Wrist 2.5 ?3.1 8 ?5 Dig V - Wrist 39             F  Wave    Nerve F Lat Ref.   ms ms  R Ulnar - ADM 26.8 ?32.0       EMG Summary Table    Spontaneous MUAP Recruitment  Muscle IA Fib PSW Fasc Other Amp Dur. Poly Pattern  R. Deltoid Normal None None None _______ Normal Normal Normal Normal  R. Triceps brachii Normal None None None _______ Normal Normal Normal Normal  R. Pronator teres Normal None None None _______ Normal Normal Normal Normal  R. Opponens pollicis Normal None None None _______ Normal Normal Normal Normal  R. First dorsal interosseous Normal None None None _______ Normal Normal Normal Normal

## 2021-09-13 NOTE — Procedures (Signed)
° ° ° °   °Full Name: Jessica Mclaughlin Gender: Female °MRN #: [id number] Date of Birth: 09/24/1960 °   °Visit Date: 09/09/2021 07:37 °Age: 60 Years °Examining Physician: Arriona Prest, MD  °Referring Physician: Tempestt Silba, MD °Height: 5 feet 4 inch, 224lbs Temp: 37.0C ° °History: This is a patient who is here for EMG nerve conduction study today.  I initially saw her in December 2022 for numbness and tingling in the hands and muscular shoulder pain after sleeping on a couch for 4 weeks.  Diagnosed with cervical myofascial strain and possibly carpal tunnel syndrome with positive McPhalen's maneuver at the wrists.  ESR for polymyalgia rheumatica was negative.  MRI of the cervical spine did not show any etiology. ° °Summary:  ° °Nerve Conduction Studies were performed on the bilateral upper extremities.The right median APB motor nerve showed prolonged distal onset latency (4.8 ms, N<4.4). The right Median 2nd Digit orthodromic sensory nerve showed no response. The left  Median 2nd Digit orthodromic sensory nerve showed no response.All remaining nerves (as indicated in the following tables) were within normal limits. EMG needle study performed on the right upper extremity. All muscles (as indicated in the following tables) were within normal limits.   ° ° ° ° Conclusion: There is electrophysiologic evidence of moderately-severe right Carpal Tunnel Syndrome and mild left Carpal Tunnel Syndrome.  No suggestion of polyneuropathy or radiculopathy.  ° °Orders Placed This Encounter  °Procedures  ° Ambulatory referral to Hand Surgery  ° ° ° ° ° °------------------------------- °Zyron Deeley M.D. ° °[facility] °[address], [address] °[place], [place] [address] °Tel: [phone] °Fax: [phone] ° °Verbal informed consent was obtained from the patient, patient was informed of potential risk of procedure, including bruising, bleeding, hematoma formation, infection, muscle weakness, muscle pain, numbness, among  others. °   ° °   °MNC °   °Nerve / Sites Muscle Latency Ref. Amplitude Ref. Rel Amp Segments Distance Velocity Ref. Area  °  ms ms mV mV %  cm m/s m/s mVms  °R Median - APB  °   Wrist APB 4.8 ?4.4 8.5 ?4.0 100 Wrist - APB 7   28.1  °   Upper arm APB 9.1  3.7  43.9 Upper arm - Wrist 21 49 ?49 12.5  °   Ulnar Below Elbow APB 6.2  2.5  67.5 Ulnar Below Elbow - APB    4.8  °   Ulnar Above Elbow APB 7.8  2.9  113 Ulnar Above Elbow - APB    5.2  °L Median - APB  °   Wrist APB 4.4 ?4.4 10.2 ?4.0 100 Wrist - APB 7   29.8  °   Upper arm APB 8.5  7.9  77 Upper arm - Wrist 20 49 ?49 22.8  °R Ulnar - ADM  °   Wrist ADM 2.1 ?3.3 9.3 ?6.0 100 Wrist - ADM 7   22.6  °   B.Elbow ADM 5.1  8.8  94.4 B.Elbow - Wrist 19 62 ?49 21.8  °   A.Elbow ADM 6.8  8.6  98.2 A.Elbow - B.Elbow 10 60 ?49 21.5  °         °SNC °   °Nerve / Sites Rec. Site Peak Lat Ref.  Amp Ref. Segments Distance  °  ms ms µV µV  cm  °R Median - Orthodromic (Dig II, Mid palm)  °   Dig II Wrist NR ?3.4 NR ?10 Dig II - Wrist 13  °  L Median - Orthodromic (Dig II, Mid palm)  °   Dig II Wrist NR ?3.4 NR ?10 Dig II - Wrist 13  °R Ulnar - Orthodromic, (Dig V, Mid palm)  °   Dig V Wrist 2.8 ?3.1 6 ?5 Dig V - Wrist 11  °L Ulnar - Orthodromic, (Dig V, Mid palm)  °   Dig V Wrist 2.5 ?3.1 8 ?5 Dig V - Wrist 11  °           °F  Wave °   °Nerve F Lat Ref.  ° ms ms  °R Ulnar - ADM 26.8 ?32.0  °     °EMG Summary Table   ° Spontaneous MUAP Recruitment  °Muscle IA Fib PSW Fasc Other Amp Dur. Poly Pattern  °R. Deltoid Normal None None None _______ Normal Normal Normal Normal  °R. Triceps brachii Normal None None None _______ Normal Normal Normal Normal  °R. Pronator teres Normal None None None _______ Normal Normal Normal Normal  °R. Opponens pollicis Normal None None None _______ Normal Normal Normal Normal  °R. First dorsal interosseous Normal None None None _______ Normal Normal Normal Normal  ° °  °

## 2021-09-14 ENCOUNTER — Telehealth: Payer: Self-pay | Admitting: Neurology

## 2021-09-14 NOTE — Telephone Encounter (Signed)
[facility] to [facility] [phone]. ?

## 2021-09-20 NOTE — Progress Notes (Signed)
See procedure note.

## 2022-02-15 ENCOUNTER — Other Ambulatory Visit: Payer: Self-pay | Admitting: Family Medicine

## 2022-02-15 DIAGNOSIS — Z1231 Encounter for screening mammogram for malignant neoplasm of breast: Secondary | ICD-10-CM

## 2022-03-30 ENCOUNTER — Ambulatory Visit
Admission: RE | Admit: 2022-03-30 | Discharge: 2022-03-30 | Disposition: A | Discharge status: Home / Self Care | Payer: No Typology Code available for payment source | Source: Ambulatory Visit | Attending: Family Medicine | Admitting: Family Medicine

## 2022-03-30 DIAGNOSIS — Z1231 Encounter for screening mammogram for malignant neoplasm of breast: Secondary | ICD-10-CM

## 2022-08-25 ENCOUNTER — Other Ambulatory Visit (HOSPITAL_COMMUNITY): Payer: Self-pay

## 2022-08-25 MED ORDER — WEGOVY 0.5 MG/0.5ML ~~LOC~~ SOAJ
0.5000 mg | SUBCUTANEOUS | 0 refills | Status: AC
  Filled 2022-08-25: qty 2, 30d supply, fill #0
  Filled 2022-09-21 – 2022-10-13 (×4): qty 2, 28d supply, fill #0

## 2022-08-25 MED ORDER — WEGOVY 1 MG/0.5ML ~~LOC~~ SOAJ
SUBCUTANEOUS | 0 refills | Status: AC
  Filled 2022-08-25: qty 2, 30d supply, fill #0
  Filled 2022-11-23: qty 2, 28d supply, fill #0

## 2022-08-25 MED ORDER — WEGOVY 0.25 MG/0.5ML ~~LOC~~ SOAJ
SUBCUTANEOUS | 0 refills | Status: AC
  Filled 2022-08-25: qty 2, 30d supply, fill #0
  Filled 2022-08-26 – 2022-09-16 (×4): qty 2, 28d supply, fill #0

## 2022-08-26 ENCOUNTER — Other Ambulatory Visit (HOSPITAL_COMMUNITY): Payer: Self-pay

## 2022-09-06 ENCOUNTER — Other Ambulatory Visit (HOSPITAL_COMMUNITY): Payer: Self-pay

## 2022-09-07 ENCOUNTER — Other Ambulatory Visit (HOSPITAL_COMMUNITY): Payer: Self-pay

## 2022-09-16 ENCOUNTER — Other Ambulatory Visit (HOSPITAL_COMMUNITY): Payer: Self-pay

## 2022-09-21 ENCOUNTER — Other Ambulatory Visit (HOSPITAL_COMMUNITY): Payer: Self-pay

## 2022-09-21 ENCOUNTER — Other Ambulatory Visit: Payer: Self-pay

## 2022-09-22 ENCOUNTER — Other Ambulatory Visit: Payer: Self-pay

## 2022-09-22 ENCOUNTER — Other Ambulatory Visit (HOSPITAL_COMMUNITY): Payer: Self-pay

## 2022-10-13 ENCOUNTER — Other Ambulatory Visit: Payer: Self-pay

## 2022-10-13 ENCOUNTER — Other Ambulatory Visit (HOSPITAL_COMMUNITY): Payer: Self-pay

## 2022-10-13 ENCOUNTER — Other Ambulatory Visit (HOSPITAL_BASED_OUTPATIENT_CLINIC_OR_DEPARTMENT_OTHER): Payer: Self-pay

## 2022-10-15 ENCOUNTER — Other Ambulatory Visit (HOSPITAL_COMMUNITY): Payer: Self-pay

## 2022-11-23 ENCOUNTER — Other Ambulatory Visit (HOSPITAL_COMMUNITY): Payer: Self-pay

## 2022-12-22 ENCOUNTER — Other Ambulatory Visit: Payer: Self-pay

## 2022-12-22 ENCOUNTER — Other Ambulatory Visit (HOSPITAL_COMMUNITY): Payer: Self-pay

## 2022-12-22 MED ORDER — WEGOVY 1.7 MG/0.75ML ~~LOC~~ SOAJ
1.7000 mg | SUBCUTANEOUS | 0 refills | Status: AC
Start: 2022-12-22 — End: ?
  Filled 2022-12-22 – 2022-12-28 (×2): qty 3, 28d supply, fill #0

## 2022-12-28 ENCOUNTER — Other Ambulatory Visit (HOSPITAL_COMMUNITY): Payer: Self-pay

## 2023-01-06 ENCOUNTER — Other Ambulatory Visit (HOSPITAL_COMMUNITY): Payer: Self-pay

## 2023-02-21 ENCOUNTER — Other Ambulatory Visit: Payer: Self-pay | Admitting: Family Medicine

## 2023-02-21 DIAGNOSIS — Z1231 Encounter for screening mammogram for malignant neoplasm of breast: Secondary | ICD-10-CM

## 2023-04-04 ENCOUNTER — Ambulatory Visit: Payer: No Typology Code available for payment source

## 2023-04-12 ENCOUNTER — Ambulatory Visit
Admission: RE | Admit: 2023-04-12 | Discharge: 2023-04-12 | Disposition: A | Discharge status: Home / Self Care | Payer: No Typology Code available for payment source | Source: Ambulatory Visit | Attending: Family Medicine | Admitting: Family Medicine

## 2023-04-12 DIAGNOSIS — Z1231 Encounter for screening mammogram for malignant neoplasm of breast: Secondary | ICD-10-CM

## 2024-03-04 ENCOUNTER — Other Ambulatory Visit: Payer: Self-pay | Admitting: Family Medicine

## 2024-03-04 DIAGNOSIS — Z1231 Encounter for screening mammogram for malignant neoplasm of breast: Secondary | ICD-10-CM

## 2024-04-12 ENCOUNTER — Ambulatory Visit

## 2024-04-17 ENCOUNTER — Ambulatory Visit
Admission: RE | Admit: 2024-04-17 | Discharge: 2024-04-17 | Disposition: A | Discharge status: Home / Self Care | Source: Ambulatory Visit | Attending: Family Medicine | Admitting: Family Medicine

## 2024-04-17 DIAGNOSIS — Z1231 Encounter for screening mammogram for malignant neoplasm of breast: Secondary | ICD-10-CM
